# Patient Record
Sex: Male | Born: 1945 | Race: Black or African American | Hispanic: No | Marital: Married | Smoking: Former smoker
Health system: Southern US, Community
[De-identification: ages and names within clinical notes are randomized; demographics above are authoritative.]

## PROBLEM LIST (undated history)

## (undated) DIAGNOSIS — M1990 Unspecified osteoarthritis, unspecified site: Secondary | ICD-10-CM

## (undated) DIAGNOSIS — J9621 Acute and chronic respiratory failure with hypoxia: Secondary | ICD-10-CM

## (undated) DIAGNOSIS — I6322 Cerebral infarction due to unspecified occlusion or stenosis of basilar arteries: Secondary | ICD-10-CM

## (undated) DIAGNOSIS — I2699 Other pulmonary embolism without acute cor pulmonale: Secondary | ICD-10-CM

## (undated) DIAGNOSIS — I482 Chronic atrial fibrillation, unspecified: Secondary | ICD-10-CM

## (undated) DIAGNOSIS — N170 Acute kidney failure with tubular necrosis: Secondary | ICD-10-CM

## (undated) DIAGNOSIS — R6521 Severe sepsis with septic shock: Secondary | ICD-10-CM

## (undated) DIAGNOSIS — I219 Acute myocardial infarction, unspecified: Secondary | ICD-10-CM

## (undated) DIAGNOSIS — A419 Sepsis, unspecified organism: Secondary | ICD-10-CM

## (undated) DIAGNOSIS — J690 Pneumonitis due to inhalation of food and vomit: Secondary | ICD-10-CM

---

## 2018-03-28 ENCOUNTER — Inpatient Hospital Stay
Admission: AD | Admit: 2018-03-28 | Discharge: 2018-05-28 | Disposition: E | Payer: Medicare HMO | Source: Ambulatory Visit | Attending: Internal Medicine | Admitting: Internal Medicine

## 2018-03-28 ENCOUNTER — Other Ambulatory Visit (HOSPITAL_COMMUNITY): Payer: Self-pay

## 2018-03-28 DIAGNOSIS — Z992 Dependence on renal dialysis: Secondary | ICD-10-CM

## 2018-03-28 DIAGNOSIS — I482 Chronic atrial fibrillation, unspecified: Secondary | ICD-10-CM | POA: Diagnosis present

## 2018-03-28 DIAGNOSIS — J9690 Respiratory failure, unspecified, unspecified whether with hypoxia or hypercapnia: Secondary | ICD-10-CM

## 2018-03-28 DIAGNOSIS — R6521 Severe sepsis with septic shock: Secondary | ICD-10-CM

## 2018-03-28 DIAGNOSIS — J690 Pneumonitis due to inhalation of food and vomit: Secondary | ICD-10-CM | POA: Diagnosis present

## 2018-03-28 DIAGNOSIS — T8241XA Breakdown (mechanical) of vascular dialysis catheter, initial encounter: Secondary | ICD-10-CM

## 2018-03-28 DIAGNOSIS — N170 Acute kidney failure with tubular necrosis: Secondary | ICD-10-CM

## 2018-03-28 DIAGNOSIS — I2699 Other pulmonary embolism without acute cor pulmonale: Secondary | ICD-10-CM | POA: Diagnosis present

## 2018-03-28 DIAGNOSIS — I6322 Cerebral infarction due to unspecified occlusion or stenosis of basilar arteries: Secondary | ICD-10-CM | POA: Diagnosis present

## 2018-03-28 DIAGNOSIS — J189 Pneumonia, unspecified organism: Secondary | ICD-10-CM

## 2018-03-28 DIAGNOSIS — A419 Sepsis, unspecified organism: Secondary | ICD-10-CM | POA: Diagnosis present

## 2018-03-28 DIAGNOSIS — Z539 Procedure and treatment not carried out, unspecified reason: Secondary | ICD-10-CM

## 2018-03-28 DIAGNOSIS — J9621 Acute and chronic respiratory failure with hypoxia: Secondary | ICD-10-CM

## 2018-03-28 DIAGNOSIS — Z431 Encounter for attention to gastrostomy: Secondary | ICD-10-CM

## 2018-03-28 HISTORY — DX: Acute and chronic respiratory failure with hypoxia: J96.21

## 2018-03-28 HISTORY — DX: Severe sepsis with septic shock: R65.21

## 2018-03-28 HISTORY — DX: Unspecified osteoarthritis, unspecified site: M19.90

## 2018-03-28 HISTORY — DX: Acute kidney failure with tubular necrosis: N17.0

## 2018-03-28 HISTORY — DX: Chronic atrial fibrillation, unspecified: I48.20

## 2018-03-28 HISTORY — DX: Cerebral infarction due to unspecified occlusion or stenosis of basilar artery: I63.22

## 2018-03-28 HISTORY — DX: Pneumonitis due to inhalation of food and vomit: J69.0

## 2018-03-28 HISTORY — DX: Other pulmonary embolism without acute cor pulmonale: I26.99

## 2018-03-28 HISTORY — DX: Sepsis, unspecified organism: A41.9

## 2018-03-28 HISTORY — DX: Acute myocardial infarction, unspecified: I21.9

## 2018-03-28 LAB — PROTIME-INR
INR: 1.26
Prothrombin Time: 15.7 seconds — ABNORMAL HIGH (ref 11.4–15.2)

## 2018-03-28 LAB — C DIFFICILE QUICK SCREEN W PCR REFLEX
C DIFFICILE (CDIFF) INTERP: NOT DETECTED
C Diff antigen: NEGATIVE
C Diff toxin: NEGATIVE

## 2018-03-28 LAB — HEPARIN LEVEL (UNFRACTIONATED): Heparin Unfractionated: <0.1 IU/mL — ABNORMAL LOW (ref 0.30–0.70)

## 2018-03-28 MED ORDER — PNEUMOCOCCAL 13-VAL CONJ VACC IM SUSP
0.50 | INTRAMUSCULAR | Status: DC
Start: ? — End: 2018-03-28

## 2018-03-28 MED ORDER — DEXTROSE-NACL 5-0.9 % IV SOLN
50.00 | INTRAVENOUS | Status: DC
Start: ? — End: 2018-03-28

## 2018-03-28 MED ORDER — ACETAMINOPHEN 160 MG/5ML PO SUSP
650.00 | ORAL | Status: DC
Start: ? — End: 2018-03-28

## 2018-03-28 MED ORDER — LABETALOL HCL 100 MG PO TABS
100.00 | ORAL_TABLET | ORAL | Status: DC
Start: 2018-03-28 — End: 2018-03-28

## 2018-03-28 MED ORDER — GENERIC EXTERNAL MEDICATION
5.00 | Status: DC
Start: ? — End: 2018-03-28

## 2018-03-28 MED ORDER — POTASSIUM CHLORIDE CRYS ER 20 MEQ PO TBCR
20.00 | EXTENDED_RELEASE_TABLET | ORAL | Status: DC
Start: ? — End: 2018-03-28

## 2018-03-28 MED ORDER — IOPAMIDOL (ISOVUE-300) INJECTION 61%
INTRAVENOUS | Status: AC
  Administered 2018-03-28: 50 mL via GASTROSTOMY
  Filled 2018-03-28: qty 50

## 2018-03-28 MED ORDER — INSULIN LISPRO 100 UNIT/ML ~~LOC~~ SOLN
1.00 | SUBCUTANEOUS | Status: DC
Start: ? — End: 2018-03-28

## 2018-03-28 MED ORDER — ALTEPLASE 2 MG IJ SOLR
2.00 | INTRAMUSCULAR | Status: DC
Start: ? — End: 2018-03-28

## 2018-03-28 MED ORDER — MANNITOL 25 % IV SOLN
12.50 | INTRAVENOUS | Status: DC
Start: ? — End: 2018-03-28

## 2018-03-28 MED ORDER — ATORVASTATIN CALCIUM 20 MG PO TABS
20.00 | ORAL_TABLET | ORAL | Status: DC
Start: 2018-03-28 — End: 2018-03-28

## 2018-03-28 MED ORDER — NYSTATIN 100000 UNIT/ML MT SUSP
400000.00 | OROMUCOSAL | Status: DC
Start: 2018-03-28 — End: 2018-03-28

## 2018-03-28 MED ORDER — CLOPIDOGREL BISULFATE 75 MG PO TABS
75.00 | ORAL_TABLET | ORAL | Status: DC
Start: 2018-03-29 — End: 2018-03-28

## 2018-03-28 MED ORDER — POTASSIUM CHLORIDE 20 MEQ/15ML (10%) PO SOLN
20.00 | ORAL | Status: DC
Start: ? — End: 2018-03-28

## 2018-03-28 MED ORDER — POTASSIUM CHLORIDE 20 MEQ/50ML IV SOLN
20.00 | INTRAVENOUS | Status: DC
Start: ? — End: 2018-03-28

## 2018-03-28 MED ORDER — NICARDIPINE HCL IN NACL 40-0.83 MG/200ML-% IV SOLN
2.50 | INTRAVENOUS | Status: DC
Start: ? — End: 2018-03-28

## 2018-03-28 MED ORDER — ONDANSETRON HCL 4 MG/2ML IJ SOLN
4.00 | INTRAMUSCULAR | Status: DC
Start: ? — End: 2018-03-28

## 2018-03-28 MED ORDER — SODIUM CHLORIDE 0.9 % IV SOLN
150.00 | INTRAVENOUS | Status: DC
Start: ? — End: 2018-03-28

## 2018-03-28 MED ORDER — WARFARIN SODIUM 5 MG PO TABS
5.00 | ORAL_TABLET | ORAL | Status: DC
Start: 2018-03-28 — End: 2018-03-28

## 2018-03-28 MED ORDER — DIPHENHYDRAMINE HCL 50 MG/ML IJ SOLN
12.50 | INTRAMUSCULAR | Status: DC
Start: ? — End: 2018-03-28

## 2018-03-28 MED ORDER — AMLODIPINE BESYLATE 10 MG PO TABS
10.00 | ORAL_TABLET | ORAL | Status: DC
Start: 2018-03-29 — End: 2018-03-28

## 2018-03-28 MED ORDER — HEPARIN SOD (PORCINE) IN D5W 100 UNIT/ML IV SOLN
7.67 | INTRAVENOUS | Status: DC
Start: ? — End: 2018-03-28

## 2018-03-28 MED ORDER — INSULIN LISPRO 100 UNIT/ML ~~LOC~~ SOLN
1.00 | SUBCUTANEOUS | Status: DC
Start: 2018-03-28 — End: 2018-03-28

## 2018-03-28 MED ORDER — FAMOTIDINE 20 MG/2ML IV SOLN
20.00 | INTRAVENOUS | Status: DC
Start: ? — End: 2018-03-28

## 2018-03-28 MED ORDER — WARFARIN SODIUM 1 MG PO TABS
ORAL_TABLET | ORAL | Status: DC
Start: ? — End: 2018-03-28

## 2018-03-28 MED ORDER — CLOTRIMAZOLE 1 % EX CREA
TOPICAL_CREAM | CUTANEOUS | Status: DC
Start: 2018-03-28 — End: 2018-03-28

## 2018-03-28 MED ORDER — BISACODYL 10 MG RE SUPP
10.00 | RECTAL | Status: DC
Start: ? — End: 2018-03-28

## 2018-03-28 MED ORDER — HYDRALAZINE HCL 20 MG/ML IJ SOLN
10.00 | INTRAMUSCULAR | Status: DC
Start: ? — End: 2018-03-28

## 2018-03-28 MED ORDER — POLYETHYLENE GLYCOL 3350 17 G PO PACK
17.00 | PACK | ORAL | Status: DC
Start: 2018-03-28 — End: 2018-03-28

## 2018-03-28 MED ORDER — FUROSEMIDE 10 MG/ML IJ SOLN
80.00 | INTRAMUSCULAR | Status: DC
Start: 2018-03-28 — End: 2018-03-28

## 2018-03-28 MED ORDER — ACETAMINOPHEN 650 MG RE SUPP
650.00 | RECTAL | Status: DC
Start: ? — End: 2018-03-28

## 2018-03-28 MED ORDER — SODIUM CHLORIDE 0.9 % IJ SOLN
50.00 | INTRAMUSCULAR | Status: DC
Start: ? — End: 2018-03-28

## 2018-03-28 MED ORDER — GENERIC EXTERNAL MEDICATION
1.00 | Status: DC
Start: 2018-03-29 — End: 2018-03-28

## 2018-03-28 MED ORDER — INSULIN GLARGINE 100 UNIT/ML ~~LOC~~ SOLN
1.00 | SUBCUTANEOUS | Status: DC
Start: 2018-03-28 — End: 2018-03-28

## 2018-03-28 MED ORDER — ALBUMIN HUMAN 25 % IV SOLN
12.50 | INTRAVENOUS | Status: DC
Start: ? — End: 2018-03-28

## 2018-03-28 MED ORDER — DARBEPOETIN ALFA 40 MCG/ML IJ SOLN
40.00 | INTRAMUSCULAR | Status: DC
Start: 2018-04-03 — End: 2018-03-28

## 2018-03-28 MED ORDER — LABETALOL HCL 5 MG/ML IV SOLN
10.00 | INTRAVENOUS | Status: DC
Start: ? — End: 2018-03-28

## 2018-03-28 NOTE — Progress Notes (Signed)
[facility] CONSULT NOTE    Date: 04/05/2018                  Patient Name:  Curtis Hopkins  MRN: [id number]  DOB: 1946/10/10  Age / Sex: 72 y.o., male         PCP: System, Pcp Not In                 Service Requesting Consult: Hospitalist                 Reason for Consult: Acute renal failure            History of Present Illness: Patient is a 72 y.o. 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  Patient initially presented outside hospital with facial droop as well as dysarthria.  During this most recent hospitalization he also developed acute respiratory failure and is status post tracheostomy placement as well as PEG tube placement.  In addition he had worsening renal function during the course of the hospitalization and temporary left internal jugular dialysis catheter was placed and he was initiated on dialysis just yesterday.  His hospital course was also complicated by large pulmonary embolism of the right main pulmonary artery.  He was started on heparin drip for this purpose.   Medications:  Current medications: Amlodipine 10 mg daily, cefepime 1 g IV daily, heparin drip, Lipitor 20 mg nightly, nystatin 5 cc p.o. 4 times daily, warfarin 5 mg daily, labetalol 100 mg p.o. 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  Patient was a former smoker but quit in his 76s.  No alcohol or illicit drug use.  Review of Systems: Patient unable to provide as he is currently on the ventilator  Vital Signs: Pulse 80 respirations 16 blood pressure 153/74 pulse ox 91% Weight trends: There were no vitals filed for this visit.  Physical Exam: General: Critically ill appearing  Head: Normocephalic, atraumatic.  Eyes: Anicteric, spontaneous EOMs noted  Nose: Mucous membranes moist, not inflammed, nonerythematous.  Throat: Oral mucosa moist  Neck: Tracheostomy in place, L temporary dialysis catheter in place  Lungs:  Scattered rhonchi, normal effort  Heart: S1S2 no rubs  Abdomen:  Soft NTND BS present, PEG in place  Extremities: 1+ pretibial edema.  Neurologic: Arousable, does track with eyes  Skin: No visible rashes, scars.    Lab results: Basic Metabolic Panel: No results for input(s): NA, K, CL, CO2, GLUCOSE, BUN, CREATININE, CALCIUM, MG, PHOS in the last 168 hours.  Liver Function Tests: No results for input(s): AST, ALT, ALKPHOS, BILITOT, PROT, ALBUMIN in the last 168 hours. No results for input(s): LIPASE, AMYLASE in the last  168 hours. No results for input(s): AMMONIA in the last 168 hours.  CBC: No results for input(s): WBC, NEUTROABS, HGB, HCT, MCV, PLT in the last 168 hours.  Cardiac Enzymes: No results for input(s): CKTOTAL, CKMB, CKMBINDEX, TROPONINI in the last 168 hours.  BNP: Invalid input(s): POCBNP  CBG: No results for input(s): GLUCAP in the last 168 hours.  Microbiology: No results found for this or any previous visit.  Coagulation Studies: No results for input(s): LABPROT, INR  in the last 72 hours.  Urinalysis: No results for input(s): COLORURINE, LABSPEC, PHURINE, GLUCOSEU, HGBUR, BILIRUBINUR, KETONESUR, PROTEINUR, UROBILINOGEN, NITRITE, LEUKOCYTESUR in the last 72 hours.  Invalid input(s): APPERANCEUR    Imaging:  No results found.   Assessment & Plan: Pt is a 72 y.o. 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  Patient initially presented outside hospital with facial droop as well as dysarthria.  During prior hospitalization he also developed acute respiratory failure and is status post tracheostomy placement as well as PEG tube placement.  In addition he had worsening renal function during the course of the hospitalization and temporary left internal jugular dialysis catheter was placed and he was initiated on dialysis immediately before transfer here.   1.  Acute renal failure secondary to ATN. 2.  Acute respiratory failure. 3.  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 4.  Anemia unspecified.  Plan:  We were asked to see the patient for evaluation of his acute renal failure.  He was started on dialysis immediately prior to being discharged here.  It appears that he tolerated his first dialysis treatment well.  We will plan for dialysis again tomorrow.  He is making urine at the moment.  Therefore we remain hopeful that he will be able to come off of dialysis over the course of this hospitalization.  Pulmonary/critical care has also been consulted and hopefully patient can be weaned from the ventilator.  Nutritional support as per hospitalist.  Further plan as patient progresses.  1.  Acute

## 2018-03-29 ENCOUNTER — Encounter: Payer: Self-pay | Admitting: Internal Medicine

## 2018-03-29 DIAGNOSIS — J690 Pneumonitis due to inhalation of food and vomit: Secondary | ICD-10-CM | POA: Diagnosis present

## 2018-03-29 DIAGNOSIS — A419 Sepsis, unspecified organism: Secondary | ICD-10-CM | POA: Diagnosis not present

## 2018-03-29 DIAGNOSIS — J9621 Acute and chronic respiratory failure with hypoxia: Secondary | ICD-10-CM

## 2018-03-29 DIAGNOSIS — R6521 Severe sepsis with septic shock: Secondary | ICD-10-CM

## 2018-03-29 DIAGNOSIS — I2699 Other pulmonary embolism without acute cor pulmonale: Secondary | ICD-10-CM | POA: Diagnosis not present

## 2018-03-29 DIAGNOSIS — I482 Chronic atrial fibrillation, unspecified: Secondary | ICD-10-CM | POA: Diagnosis present

## 2018-03-29 DIAGNOSIS — I6322 Cerebral infarction due to unspecified occlusion or stenosis of basilar arteries: Secondary | ICD-10-CM | POA: Diagnosis present

## 2018-03-29 HISTORY — DX: Acute and chronic respiratory failure with hypoxia: J96.21

## 2018-03-29 LAB — COMPREHENSIVE METABOLIC PANEL
ALK PHOS: 139 U/L — AB (ref 38–126)
ALT: 134 U/L — AB (ref 17–63)
AST: 114 U/L — ABNORMAL HIGH (ref 15–41)
Albumin: 1.5 g/dL — ABNORMAL LOW (ref 3.5–5.0)
Anion gap: 12 (ref 5–15)
BILIRUBIN TOTAL: 0.5 mg/dL (ref 0.3–1.2)
BUN: 117 mg/dL — ABNORMAL HIGH (ref 6–20)
CALCIUM: 7.8 mg/dL — AB (ref 8.9–10.3)
CO2: 17 mmol/L — ABNORMAL LOW (ref 22–32)
CREATININE: 5.74 mg/dL — AB (ref 0.61–1.24)
Chloride: 110 mmol/L (ref 101–111)
GFR, EST AFRICAN AMERICAN: 10 mL/min — AB (ref >60)
GFR, EST NON AFRICAN AMERICAN: 9 mL/min — AB (ref >60)
Glucose, Bld: 98 mg/dL (ref 65–99)
Potassium: 4.9 mmol/L (ref 3.5–5.1)
Sodium: 139 mmol/L (ref 135–145)
Total Protein: 6.1 g/dL — ABNORMAL LOW (ref 6.5–8.1)

## 2018-03-29 LAB — URINALYSIS, ROUTINE W REFLEX MICROSCOPIC
Bilirubin Urine: NEGATIVE
GLUCOSE, UA: NEGATIVE mg/dL
Ketones, ur: NEGATIVE mg/dL
Leukocytes, UA: NEGATIVE
NITRITE: NEGATIVE
Protein, ur: NEGATIVE mg/dL
SPECIFIC GRAVITY, URINE: 1.008 (ref 1.005–1.030)
pH: 5 (ref 5.0–8.0)

## 2018-03-29 LAB — TSH: TSH: 1.998 u[IU]/mL (ref 0.350–4.500)

## 2018-03-29 LAB — CBC WITH DIFFERENTIAL/PLATELET
Basophils Absolute: 0 10*3/uL (ref 0.0–0.1)
Basophils Relative: 0 %
EOS ABS: 0.5 10*3/uL (ref 0.0–0.7)
EOS PCT: 3 %
HCT: 24.4 % — ABNORMAL LOW (ref 39.0–52.0)
Hemoglobin: 8.2 g/dL — ABNORMAL LOW (ref 13.0–17.0)
LYMPHS ABS: 1.4 10*3/uL (ref 0.7–4.0)
LYMPHS PCT: 8 %
MCH: 29.1 pg (ref 26.0–34.0)
MCHC: 33.6 g/dL (ref 30.0–36.0)
MCV: 86.5 fL (ref 78.0–100.0)
MONO ABS: 1.3 10*3/uL — AB (ref 0.1–1.0)
MONOS PCT: 8 %
Neutro Abs: 13.4 10*3/uL — ABNORMAL HIGH (ref 1.7–7.7)
Neutrophils Relative %: 81 %
PLATELETS: 307 10*3/uL (ref 150–400)
RBC: 2.82 MIL/uL — ABNORMAL LOW (ref 4.22–5.81)
RDW: 16.5 % — AB (ref 11.5–15.5)
WBC: 16.7 10*3/uL — ABNORMAL HIGH (ref 4.0–10.5)

## 2018-03-29 LAB — HEPARIN LEVEL (UNFRACTIONATED): HEPARIN UNFRACTIONATED: 0.38 [IU]/mL (ref 0.30–0.70)

## 2018-03-29 LAB — PROTIME-INR
INR: 1.4
Prothrombin Time: 17 seconds — ABNORMAL HIGH (ref 11.4–15.2)

## 2018-03-29 LAB — APTT: aPTT: 58 seconds — ABNORMAL HIGH (ref 24–36)

## 2018-03-29 LAB — HEMOGLOBIN A1C
HEMOGLOBIN A1C: 6.5 % — AB (ref 4.8–5.6)
Mean Plasma Glucose: 139.85 mg/dL

## 2018-03-29 LAB — MAGNESIUM: MAGNESIUM: 2.7 mg/dL — AB (ref 1.7–2.4)

## 2018-03-29 LAB — PHOSPHORUS: PHOSPHORUS: 6.5 mg/dL — AB (ref 2.5–4.6)

## 2018-03-29 NOTE — Consult Note (Signed)
Pulmonary Critical Care Medicine [facility] PULMONARY SERVICE  Date of Service: 03/29/2018  PULMONARY CONSULT   Curtis Hopkins  ZOX:[id number]  DOB: 1946/06/14   DOA: 03/30/2018  Referring Physician: Carron Curie, MD  HPI: Curtis Hopkins is a 72 y.o. male seen for follow up of Acute on Chronic Respiratory Failure.  She has multiple medical problems including presented to hospital with acute stroke involving the basilar artery.  Patient had an angioplasty done for the stroke.  Postoperatively had a complicated course was not able to really wean off the ventilator.  The patient eventually ended up having to have a tracheostomy and PEG tube placement.  Patient's other complications included development of a pulmonary embolism which was treated with heparin.  Also patient had significant worsening of renal failure.  It appears by history that there was some improvement on the renal failure.  Patient now presents our facility on T collar has been on 40% oxygen and been gradually weaning off of the ventilator.  Family was present in the room and they were updated.  Review of Systems:  ROS performed and is unremarkable other than noted above.  Past Medical History:  Diagnosis Date  . Acute on chronic respiratory failure with hypoxia (HCC) 03/29/2018  . Acute pulmonary embolism (HCC)   . Arthritis   . Aspiration pneumonia (HCC)   . Atrial fibrillation, chronic (HCC)   . Myocardial infarction (HCC)   . 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 No pertinent surgical history.  Social History:    reports that he has quit smoking. He has quit using smokeless tobacco. He reports that he drank alcohol. He reports that he has current or past drug history.  Family History: Non-Contributory to the present illness  No Known Allergies  Medications: Reviewed on Rounds  Physical Exam:  Vitals: Temperature 98.4 pulse 79 respiratory rate 23 blood  pressure 101/48 saturations 94%  Ventilator Settings currently on T collar FiO2 40%  . General: Comfortable at this time . Eyes: Grossly normal lids, irises & conjunctiva . ENT: grossly tongue is normal . Neck: no obvious mass . Cardiovascular: S1-S2 normal no gallop rub . Respiratory: No rhonchi expansion equal . Abdomen: Obese and soft . Skin: no rash seen on limited exam . Musculoskeletal: not rigid . Psychiatric:unable to assess . Neurologic: no seizure no involuntary movements         Labs on Admission:  Basic Metabolic Panel: Recent Labs  Lab 03/29/18 0731  NA 139  K 4.9  CL 110  CO2 17*  GLUCOSE 98  BUN 117*  CREATININE 5.74*  CALCIUM 7.8*  MG 2.7*  PHOS 6.5*    Liver Function Tests: Recent Labs  Lab 03/29/18 0731  AST 114*  ALT 134*  ALKPHOS 139*  BILITOT 0.5  PROT 6.1*  ALBUMIN 1.5*   No results for input(s): LIPASE, AMYLASE in the last 168 hours. No results for input(s): AMMONIA in the last 168 hours.  CBC: Recent Labs  Lab 03/29/18 0731  WBC 16.7*  NEUTROABS 13.4*  HGB 8.2*  HCT 24.4*  MCV 86.5  PLT 307    Cardiac Enzymes: No results for input(s): CKTOTAL, CKMB, CKMBINDEX, TROPONINI in the last 168 hours.  BNP (last 3 results) No results for input(s): BNP in the last 8760 hours.  ProBNP (last 3 results) No results for input(s): PROBNP in the last 8760 hours.   Radiological Exams on Admission: Dg Abdomen Peg Tube Location  Result Date: 04/09/2018 CLINICAL DATA:  Check peg tube placement with contrast. EXAM: ABDOMEN - 1 VIEW COMPARISON:  None. FINDINGS: The bowel gas pattern is normal. Enteric contrast is seen within the lumen of the stomach without extravasation. Peg tube tip projects over the proximal stomach. No radio-opaque calculi or other significant radiographic abnormality are seen. IMPRESSION: Tip of PEG tube is outlined by enteric contrast within the lumen of the stomach. Electronically Signed   By: Tollie Eth M.D.   On:  04/26/2018 19:58   Dg Chest Port 1 View  Result Date: 04/09/2018 CLINICAL DATA:  Trach tube placement EXAM: PORTABLE CHEST 1 VIEW COMPARISON:  None. FINDINGS: 1739 hours. Tracheostomy tube overlies the midline upper chest with the tip positioned about 6 cm above the carina. Left IJ central line tip overlies the proximal SVC in the tip is directed laterally, likely against the vessel wall. Right PICC line tip overlies the distal SVC. Lung volumes are low with bibasilar collapse/consolidation, right greater than left. Vascular congestion evident. Probable small bilateral pleural effusions. Telemetry leads overlie the chest. IMPRESSION: 1. Low volume film with bibasilar collapse/consolidation, right greater than left and small bilateral pleural effusions. 2. Left IJ central line tip is directed laterally, likely against the wall of the proximal SVC. 3. Right PICC line tip overlies the mid to distal SVC. Electronically Signed   By: Kennith Center M.D.   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 Acute on chronic respiratory failure with hypoxia-patient is weaning to try to advance his time off of the ventilator on T collar trials.  Currently is requiring 40% oxygen need to monitor closely.  We will continue with pulmonary toilet supportive care hopefully we will be able to advance aggressively. 2. Chronic atrial fibrillation rate is controlled right now we will continue to monitor. 3. Acute stroke due to occlusion of the basilar artery will need ongoing rehab physical therapy occupational therapy 4. Pneumonia due to aspiration patient was treated with antibiotics at the transferring facility. 5. Sepsis with shock  clinically has been improved we will continue to follow along 6. Acute renal failure patient was seen by nephrology and will be assessed for by dialysis. 7. Pulmonary embolism patient was treated with anticoagulation need to continue with supportive care  I have personally seen and evaluated the patient, evaluated laboratory and imaging results, formulated the assessment and plan and placed orders. The Patient requires high complexity decision making for assessment and support.  Case was discussed on Rounds with the Respiratory Therapy Staff Time Spent  Yevonne Pax, MD [facility] Pulmonary Critical Care Medicine Sleep Medicine

## 2018-03-30 DIAGNOSIS — I2699 Other pulmonary embolism without acute cor pulmonale: Secondary | ICD-10-CM | POA: Diagnosis not present

## 2018-03-30 DIAGNOSIS — I482 Chronic atrial fibrillation: Secondary | ICD-10-CM | POA: Diagnosis not present

## 2018-03-30 DIAGNOSIS — J690 Pneumonitis due to inhalation of food and vomit: Secondary | ICD-10-CM | POA: Diagnosis not present

## 2018-03-30 DIAGNOSIS — J9621 Acute and chronic respiratory failure with hypoxia: Secondary | ICD-10-CM | POA: Diagnosis not present

## 2018-03-30 LAB — CBC
HEMATOCRIT: 25.8 % — AB (ref 39.0–52.0)
HEMOGLOBIN: 8.6 g/dL — AB (ref 13.0–17.0)
MCH: 29.4 pg (ref 26.0–34.0)
MCHC: 33.3 g/dL (ref 30.0–36.0)
MCV: 88.1 fL (ref 78.0–100.0)
Platelets: 305 10*3/uL (ref 150–400)
RBC: 2.93 MIL/uL — AB (ref 4.22–5.81)
RDW: 16.8 % — ABNORMAL HIGH (ref 11.5–15.5)
WBC: 15.9 10*3/uL — ABNORMAL HIGH (ref 4.0–10.5)

## 2018-03-30 LAB — PROTIME-INR
INR: 1.56
Prothrombin Time: 18.5 seconds — ABNORMAL HIGH (ref 11.4–15.2)

## 2018-03-30 LAB — HEPARIN LEVEL (UNFRACTIONATED)
HEPARIN UNFRACTIONATED: 0.13 [IU]/mL — AB (ref 0.30–0.70)
Heparin Unfractionated: 0.77 IU/mL — ABNORMAL HIGH (ref 0.30–0.70)

## 2018-03-30 LAB — RENAL FUNCTION PANEL
ANION GAP: 13 (ref 5–15)
Albumin: 1.5 g/dL — ABNORMAL LOW (ref 3.5–5.0)
BUN: 126 mg/dL — ABNORMAL HIGH (ref 6–20)
CALCIUM: 8 mg/dL — AB (ref 8.9–10.3)
CO2: 17 mmol/L — AB (ref 22–32)
Chloride: 111 mmol/L (ref 101–111)
Creatinine, Ser: 5.73 mg/dL — ABNORMAL HIGH (ref 0.61–1.24)
GFR calc non Af Amer: 9 mL/min — ABNORMAL LOW (ref >60)
GFR, EST AFRICAN AMERICAN: 10 mL/min — AB (ref >60)
GLUCOSE: 114 mg/dL — AB (ref 65–99)
PHOSPHORUS: 7.6 mg/dL — AB (ref 2.5–4.6)
POTASSIUM: 5.2 mmol/L — AB (ref 3.5–5.1)
SODIUM: 141 mmol/L (ref 135–145)

## 2018-03-30 LAB — PTH, INTACT AND CALCIUM
CALCIUM TOTAL (PTH): 7.4 mg/dL — AB (ref 8.6–10.2)
PTH: 83 pg/mL — AB (ref 15–65)

## 2018-03-30 LAB — MAGNESIUM: MAGNESIUM: 2.9 mg/dL — AB (ref 1.7–2.4)

## 2018-03-30 LAB — HEPATITIS B CORE ANTIBODY, TOTAL: Hep B Core Total Ab: NEGATIVE

## 2018-03-30 LAB — HEPATITIS B SURFACE ANTIBODY, QUANTITATIVE: Hepatitis B-Post: <3.1 m[IU]/mL — ABNORMAL LOW (ref >9.9)

## 2018-03-30 LAB — HEPATITIS B SURFACE ANTIGEN: Hepatitis B Surface Ag: NEGATIVE

## 2018-03-30 NOTE — Progress Notes (Signed)
[name]  ROUNDING NOTE   Subjective:  Patient seen and evaluated during hemodialysis today. He appears to be tolerating this well. Tracheostomy in place and functional.    Objective:  Vital signs in last 24 hours:  Temperature 98.1 pulse 70 respiration 16 blood pressure 104/51  Physical Exam: General: No acute distress  Head: Normocephalic, atraumatic. Moist oral mucosal membranes  Eyes: Anicteric  Neck:  Tracheostomy present  Lungs:   Scattered rhonchi, normal effort  Heart: S1S2 no rubs  Abdomen:  Soft, nontender, bowel sounds present, PEG present  Extremities: 2+ peripheral edema.  Neurologic: Awake, alert, not consistently following commands  Skin: No lesions  Access: Left internal jugular temporary dialysis catheter    Basic Metabolic Panel: Recent Labs  Lab 03/29/18 0731  NA 139  K 4.9  CL 110  CO2 17*  GLUCOSE 98  BUN 117*  CREATININE 5.74*  CALCIUM 7.8*  7.4*  MG 2.7*  PHOS 6.5*    Liver Function Tests: Recent Labs  Lab 03/29/18 0731  AST 114*  ALT 134*  ALKPHOS 139*  BILITOT 0.5  PROT 6.1*  ALBUMIN 1.5*   No results for input(s): LIPASE, AMYLASE in the last 168 hours. No results for input(s): AMMONIA in the last 168 hours.  CBC: Recent Labs  Lab 03/29/18 0731 03/30/18 0648  WBC 16.7* 15.9*  NEUTROABS 13.4*  --   HGB 8.2* 8.6*  HCT 24.4* 25.8*  MCV 86.5 88.1  PLT 307 305    Cardiac Enzymes: No results for input(s): CKTOTAL, CKMB, CKMBINDEX, TROPONINI in the last 168 hours.  BNP: Invalid input(s): POCBNP  CBG: No results for input(s): GLUCAP in the last 168 hours.  Microbiology: Results for orders placed or performed during the hospital encounter of 2018/04/16  C difficile quick scan w PCR reflex     Status: None   Collection Time: Apr 16, 2018  3:01 PM  Result Value Ref Range Status   C Diff antigen NEGATIVE NEGATIVE Final   C Diff toxin NEGATIVE NEGATIVE Final   C Diff interpretation No C. difficile detected.   Final    Comment: Performed at [facility], [address], [place], [place] [address]    Coagulation Studies: Recent Labs    2018/04/16 1931 03/29/18 0731 03/30/18 0648  LABPROT 15.7* 17.0* 18.5*  INR 1.26 1.40 1.56    Urinalysis: Recent Labs    03/29/18 2245  COLORURINE YELLOW  LABSPEC 1.008  PHURINE 5.0  GLUCOSEU NEGATIVE  HGBUR LARGE*  BILIRUBINUR NEGATIVE  KETONESUR NEGATIVE  PROTEINUR NEGATIVE  NITRITE NEGATIVE  LEUKOCYTESUR NEGATIVE      Imaging: Dg Abdomen Peg Tube Location  Result Date: 04/16/2018 CLINICAL DATA:  Check peg tube placement with contrast. EXAM: ABDOMEN - 1 VIEW COMPARISON:  None. FINDINGS: The bowel gas pattern is normal. Enteric contrast is seen within the lumen of the stomach without extravasation. Peg tube tip projects over the proximal stomach. No radio-opaque calculi or other significant radiographic abnormality are seen. IMPRESSION: Tip of PEG tube is outlined by enteric contrast within the lumen of the stomach. Electronically Signed   By: Tollie Eth M.D.   On: 16-Apr-2018 19:58   Dg Chest Port 1 View  Result Date: 2018/04/16 CLINICAL DATA:  Trach tube placement EXAM: PORTABLE CHEST 1 VIEW COMPARISON:  None. FINDINGS: 1739 hours. Tracheostomy tube overlies the midline upper chest with the tip positioned about 6 cm above the carina. Left IJ central line tip overlies the proximal SVC in the tip is  directed laterally, likely against the vessel wall. Right PICC line tip overlies the distal SVC. Lung volumes are low with bibasilar collapse/consolidation, right greater than left. Vascular congestion evident. Probable small bilateral pleural effusions. Telemetry leads overlie the chest. IMPRESSION: 1. Low volume film with bibasilar collapse/consolidation, right greater than left and small bilateral pleural effusions. 2. Left IJ central line tip is directed laterally, likely against the wall of the proximal SVC. 3. Right PICC line tip overlies the mid to  distal SVC. Electronically Signed   By: Kennith Center M.D.   On: 04/16/18 19:56     Medications:       Assessment/ Plan:  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  Patient initially presented outside hospital with facial droop as well as dysarthria.  During prior hospitalization he also developed acute respiratory failure and is status post tracheostomy placement as well as PEG tube placement.  In addition he had worsening renal function during the course of the hospitalization and temporary left internal jugular dialysis catheter was placed and he was initiated on dialysis immediately before transfer here.   1.  Acute renal failure secondary to ATN. 2.  Acute respiratory failure. 3.  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 4.  Anemia unspecified.  Plan: Patient seen and evaluated during dialysis.  He appears to be tolerating well.  He was to be dialyzed yesterday however secondary to dialysis nurse scheduling issues he is being dialyzed today.  We will plan for dialysis again tomorrow as well.  Continue to monitor serum electrolytes as well as CBC.  Hopefully his renal function will recover over the course of the hospitalization.   LOS: 0 Kelven Flater 5/3/20198:52 AM

## 2018-03-30 NOTE — Progress Notes (Addendum)
Pulmonary Critical Care Medicine [facility]   PULMONARY SERVICE  PROGRESS NOTE  Date of Service: 03/30/2018  Curtis Hopkins  VFI:[id number]  DOB: 08/30/1946   DOA: 04/20/2018  Referring Physician: Carron Curie, MD  HPI: Curtis Hopkins is a 72 y.o. male seen for follow up of Acute on Chronic Respiratory Failure.  Patient is on T collar has been weaning the goal is for about 12 hours today.  So far looks good.  Medications: Reviewed on Rounds  Physical Exam:  Vitals: Temperature 98.1 pulse 70 respiratory rate 16 blood pressure 12/01/1948 saturations 100%  Ventilator Settings aerosolized T collar FiO2 40%  . General: Comfortable at this time . Eyes: Grossly normal lids, irises & conjunctiva . ENT: grossly tongue is normal . Neck: no obvious mass . Cardiovascular: S1-S2 normal no gallop or rub . Respiratory: No rhonchi noted either side . Abdomen: Soft nondistended . Skin: no rash seen on limited exam . Musculoskeletal: not rigid . Psychiatric:unable to assess . Neurologic: no seizure no involuntary movements         Labs on Admission:  Basic Metabolic Panel: Recent Labs  Lab 03/29/18 0731 03/30/18 0648  NA 139 141  K 4.9 5.2*  CL 110 111  CO2 17* 17*  GLUCOSE 98 114*  BUN 117* 126*  CREATININE 5.74* 5.73*  CALCIUM 7.8*  7.4* 8.0*  MG 2.7* 2.9*  PHOS 6.5* 7.6*    Liver Function Tests: Recent Labs  Lab 03/29/18 0731 03/30/18 0648  AST 114*  --   ALT 134*  --   ALKPHOS 139*  --   BILITOT 0.5  --   PROT 6.1*  --   ALBUMIN 1.5* 1.5*   No results for input(s): LIPASE, AMYLASE in the last 168 hours. No results for input(s): AMMONIA in the last 168 hours.  CBC: Recent Labs  Lab 03/29/18 0731 03/30/18 0648  WBC 16.7* 15.9*  NEUTROABS 13.4*  --   HGB 8.2* 8.6*  HCT 24.4* 25.8*  MCV 86.5 88.1  PLT 307 305    Cardiac Enzymes: No results for input(s): CKTOTAL, CKMB, CKMBINDEX, TROPONINI in the last 168 hours.  BNP (last 3  results) No results for input(s): BNP in the last 8760 hours.  ProBNP (last 3 results) No results for input(s): PROBNP in the last 8760 hours.  Radiological Exams on Admission: Dg Abdomen Peg Tube Location  Result Date: 04/11/2018 CLINICAL DATA:  Check peg tube placement with contrast. EXAM: ABDOMEN - 1 VIEW COMPARISON:  None. FINDINGS: The bowel gas pattern is normal. Enteric contrast is seen within the lumen of the stomach without extravasation. Peg tube tip projects over the proximal stomach. No radio-opaque calculi or other significant radiographic abnormality are seen. IMPRESSION: Tip of PEG tube is outlined by enteric contrast within the lumen of the stomach. Electronically Signed   By: Tollie Eth M.D.   On: 04/10/2018 19:58   Dg Chest Port 1 View  Result Date: 04/20/2018 CLINICAL DATA:  Trach tube placement EXAM: PORTABLE CHEST 1 VIEW COMPARISON:  None. FINDINGS: 1739 hours. Tracheostomy tube overlies the midline upper chest with the tip positioned about 6 cm above the carina. Left IJ central line tip overlies the proximal SVC in the tip is directed laterally, likely against the vessel wall. Right PICC line tip overlies the distal SVC. Lung volumes are low with bibasilar collapse/consolidation, right greater than left. Vascular congestion evident. Probable small bilateral pleural effusions. Telemetry leads overlie the chest. IMPRESSION: 1. Low volume film  with bibasilar collapse/consolidation, right greater than left and small bilateral pleural effusions. 2. Left IJ central line tip is directed laterally, likely against the wall of the proximal SVC. 3. Right PICC line tip overlies the mid to distal SVC. Electronically Signed   By: Kennith Center M.D.   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 Acute on chronic respiratory failure with hypoxia we will continue weaning on T collar as noted.  Secretions are minimal right now we will continue with pulmonary toilet supportive care 2. Atrial fibrillation rate is controlled we will continue to follow 3. Pneumonia due to aspiration treated with antibiotics we will continue to follow 4. Pulmonary embolism treated we will continue to monitor closely no active bleeding 5. Severe sepsis with shock resolved hemodynamically stable   I have personally seen and evaluated the patient, evaluated laboratory and imaging results, formulated the assessment and plan and placed orders. The Patient requires high complexity decision making for assessment and support.  Case was discussed on Rounds with the Respiratory Therapy Staff time spent 35 minutes  Yevonne Pax, MD [facility] Pulmonary Critical Care Medicine Sleep Medicine

## 2018-03-31 LAB — CBC
HEMATOCRIT: 27.2 % — AB (ref 39.0–52.0)
HEMOGLOBIN: 8.8 g/dL — AB (ref 13.0–17.0)
MCH: 28.6 pg (ref 26.0–34.0)
MCHC: 32.4 g/dL (ref 30.0–36.0)
MCV: 88.3 fL (ref 78.0–100.0)
Platelets: 295 10*3/uL (ref 150–400)
RBC: 3.08 MIL/uL — ABNORMAL LOW (ref 4.22–5.81)
RDW: 16.6 % — ABNORMAL HIGH (ref 11.5–15.5)
WBC: 15.5 10*3/uL — ABNORMAL HIGH (ref 4.0–10.5)

## 2018-03-31 LAB — RENAL FUNCTION PANEL
Albumin: 1.7 g/dL — ABNORMAL LOW (ref 3.5–5.0)
Anion gap: 10 (ref 5–15)
BUN: 71 mg/dL — ABNORMAL HIGH (ref 6–20)
CHLORIDE: 107 mmol/L (ref 101–111)
CO2: 22 mmol/L (ref 22–32)
CREATININE: 4.14 mg/dL — AB (ref 0.61–1.24)
Calcium: 7.8 mg/dL — ABNORMAL LOW (ref 8.9–10.3)
GFR, EST AFRICAN AMERICAN: 15 mL/min — AB (ref >60)
GFR, EST NON AFRICAN AMERICAN: 13 mL/min — AB (ref >60)
Glucose, Bld: 136 mg/dL — ABNORMAL HIGH (ref 65–99)
POTASSIUM: 4.4 mmol/L (ref 3.5–5.1)
Phosphorus: 5.7 mg/dL — ABNORMAL HIGH (ref 2.5–4.6)
Sodium: 139 mmol/L (ref 135–145)

## 2018-03-31 LAB — HEPATITIS B SURFACE ANTIGEN: Hepatitis B Surface Ag: NEGATIVE

## 2018-03-31 LAB — PROTIME-INR
INR: 1.81
Prothrombin Time: 20.8 seconds — ABNORMAL HIGH (ref 11.4–15.2)

## 2018-03-31 LAB — URINE CULTURE: Culture: NO GROWTH

## 2018-03-31 LAB — HEPARIN LEVEL (UNFRACTIONATED)
HEPARIN UNFRACTIONATED: 0.48 [IU]/mL (ref 0.30–0.70)
Heparin Unfractionated: 0.13 IU/mL — ABNORMAL LOW (ref 0.30–0.70)

## 2018-03-31 LAB — HEPATITIS B CORE ANTIBODY, TOTAL: Hep B Core Total Ab: NEGATIVE

## 2018-03-31 LAB — MAGNESIUM: MAGNESIUM: 2.3 mg/dL (ref 1.7–2.4)

## 2018-03-31 LAB — HEPATITIS B SURFACE ANTIBODY,QUALITATIVE: HEP B S AB: NONREACTIVE

## 2018-04-01 DIAGNOSIS — J690 Pneumonitis due to inhalation of food and vomit: Secondary | ICD-10-CM | POA: Diagnosis not present

## 2018-04-01 DIAGNOSIS — I482 Chronic atrial fibrillation: Secondary | ICD-10-CM | POA: Diagnosis not present

## 2018-04-01 DIAGNOSIS — I2699 Other pulmonary embolism without acute cor pulmonale: Secondary | ICD-10-CM | POA: Diagnosis not present

## 2018-04-01 DIAGNOSIS — J9621 Acute and chronic respiratory failure with hypoxia: Secondary | ICD-10-CM | POA: Diagnosis not present

## 2018-04-01 LAB — HEPARIN LEVEL (UNFRACTIONATED)
Heparin Unfractionated: 0.25 IU/mL — ABNORMAL LOW (ref 0.30–0.70)
Heparin Unfractionated: 0.54 IU/mL (ref 0.30–0.70)

## 2018-04-01 LAB — CULTURE, RESPIRATORY

## 2018-04-01 LAB — PROTIME-INR
INR: 1.99
Prothrombin Time: 22.4 seconds — ABNORMAL HIGH (ref 11.4–15.2)

## 2018-04-01 LAB — CULTURE, RESPIRATORY W GRAM STAIN: Culture: NORMAL

## 2018-04-01 NOTE — Progress Notes (Signed)
Pulmonary Critical Care Medicine [facility] [place]   PULMONARY SERVICE  PROGRESS NOTE  Date of Service: 04/01/2018  Curtis Hopkins  ZOX:[id number]  DOB: Feb 02, 1946   DOA: 04/27/2018  Referring Physician: Carron Curie, MD  HPI: Curtis Hopkins is a 72 y.o. male seen for follow up of Acute on Chronic Respiratory Failure.  Patient is on T collar right now has been using the ventilator at nighttime.  Currently is on 35% oxygen.  Medications: Reviewed on Rounds  Physical Exam:  Vitals: Temperature 97.0 pulse 72 respiratory rate 18 blood pressure 1 4472 saturations 97%  Ventilator Settings on T collar FiO2 35%  . General: Comfortable at this time . Eyes: Grossly normal lids, irises & conjunctiva . ENT: grossly tongue is normal . Neck: no obvious mass . Cardiovascular: S1-S2 normal no gallop or rub . Respiratory: Equal expansion coarse breath sounds . Abdomen: Soft nondistended . Skin: no rash seen on limited exam . Musculoskeletal: not rigid . Psychiatric:unable to assess . Neurologic: no seizure no involuntary movements         Labs on Admission:  Basic Metabolic Panel: Recent Labs  Lab 03/29/18 0731 03/30/18 0648 03/31/18 0707  NA 139 141 139  K 4.9 5.2* 4.4  CL 110 111 107  CO2 17* 17* 22  GLUCOSE 98 114* 136*  BUN 117* 126* 71*  CREATININE 5.74* 5.73* 4.14*  CALCIUM 7.8*  7.4* 8.0* 7.8*  MG 2.7* 2.9* 2.3  PHOS 6.5* 7.6* 5.7*    Liver Function Tests: Recent Labs  Lab 03/29/18 0731 03/30/18 0648 03/31/18 0707  AST 114*  --   --   ALT 134*  --   --   ALKPHOS 139*  --   --   BILITOT 0.5  --   --   PROT 6.1*  --   --   ALBUMIN 1.5* 1.5* 1.7*   No results for input(s): LIPASE, AMYLASE in the last 168 hours. No results for input(s): AMMONIA in the last 168 hours.  CBC: Recent Labs  Lab 03/29/18 0731 03/30/18 0648 03/31/18 0707  WBC 16.7* 15.9* 15.5*  NEUTROABS 13.4*  --   --   HGB 8.2* 8.6* 8.8*  HCT 24.4* 25.8* 27.2*  MCV  86.5 88.1 88.3  PLT 307 305 295    Cardiac Enzymes: No results for input(s): CKTOTAL, CKMB, CKMBINDEX, TROPONINI in the last 168 hours.  BNP (last 3 results) No results for input(s): BNP in the last 8760 hours.  ProBNP (last 3 results) No results for input(s): PROBNP in the last 8760 hours.  Radiological Exams on Admission: Dg Abdomen Peg Tube Location  Result Date: 04/24/2018 CLINICAL DATA:  Check peg tube placement with contrast. EXAM: ABDOMEN - 1 VIEW COMPARISON:  None. FINDINGS: The bowel gas pattern is normal. Enteric contrast is seen within the lumen of the stomach without extravasation. Peg tube tip projects over the proximal stomach. No radio-opaque calculi or other significant radiographic abnormality are seen. IMPRESSION: Tip of PEG tube is outlined by enteric contrast within the lumen of the stomach. Electronically Signed   By: Tollie Eth M.D.   On: 04/22/2018 19:58   Dg Chest Port 1 View  Result Date: 04/18/2018 CLINICAL DATA:  Trach tube placement EXAM: PORTABLE CHEST 1 VIEW COMPARISON:  None. FINDINGS: 1739 hours. Tracheostomy tube overlies the midline upper chest with the tip positioned about 6 cm above the carina. Left IJ central line tip overlies the proximal SVC in the tip is directed laterally, likely against  the vessel wall. Right PICC line tip overlies the distal SVC. Lung volumes are low with bibasilar collapse/consolidation, right greater than left. Vascular congestion evident. Probable small bilateral pleural effusions. Telemetry leads overlie the chest. IMPRESSION: 1. Low volume film with bibasilar collapse/consolidation, right greater than left and small bilateral pleural effusions. 2. Left IJ central line tip is directed laterally, likely against the wall of the proximal SVC. 3. Right PICC line tip overlies the mid to distal SVC. Electronically Signed   By: Kennith Center M.D.   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 Acute on chronic respiratory failure with hypoxia we will continue with S/T collar wean during the daytime will extended to 18 hours now we will continue to rest on the ventilator at nighttime.  We will continue pulmonary toilet supportive care 2. Acute pulmonary embolism clinically treated we will continue to monitor 3. Stroke 4. Aspiration pneumonia treated with antibiotics we will continue to follow 5. Severe sepsis with shock now is better 6. Chronic atrial fibrillation rate is well controlled at this time.   I have personally seen and evaluated the patient, evaluated laboratory and imaging results, formulated the assessment and plan and placed orders. The Patient requires high complexity decision making for assessment and support.  Case was discussed on Rounds with the Respiratory Therapy Staff  Yevonne Pax, MD [facility] Pulmonary Critical Care Medicine Sleep Medicine

## 2018-04-02 DIAGNOSIS — J690 Pneumonitis due to inhalation of food and vomit: Secondary | ICD-10-CM | POA: Diagnosis not present

## 2018-04-02 DIAGNOSIS — I482 Chronic atrial fibrillation: Secondary | ICD-10-CM | POA: Diagnosis not present

## 2018-04-02 DIAGNOSIS — J9621 Acute and chronic respiratory failure with hypoxia: Secondary | ICD-10-CM | POA: Diagnosis not present

## 2018-04-02 DIAGNOSIS — I2699 Other pulmonary embolism without acute cor pulmonale: Secondary | ICD-10-CM | POA: Diagnosis not present

## 2018-04-02 LAB — CBC
HEMATOCRIT: 27.8 % — AB (ref 39.0–52.0)
Hemoglobin: 9 g/dL — ABNORMAL LOW (ref 13.0–17.0)
MCH: 28.7 pg (ref 26.0–34.0)
MCHC: 32.4 g/dL (ref 30.0–36.0)
MCV: 88.5 fL (ref 78.0–100.0)
PLATELETS: 276 10*3/uL (ref 150–400)
RBC: 3.14 MIL/uL — ABNORMAL LOW (ref 4.22–5.81)
RDW: 16 % — ABNORMAL HIGH (ref 11.5–15.5)
WBC: 15.9 10*3/uL — AB (ref 4.0–10.5)

## 2018-04-02 LAB — RENAL FUNCTION PANEL
Albumin: 1.8 g/dL — ABNORMAL LOW (ref 3.5–5.0)
Anion gap: 9 (ref 5–15)
BUN: 65 mg/dL — AB (ref 6–20)
CHLORIDE: 105 mmol/L (ref 101–111)
CO2: 25 mmol/L (ref 22–32)
CREATININE: 3.63 mg/dL — AB (ref 0.61–1.24)
Calcium: 7.9 mg/dL — ABNORMAL LOW (ref 8.9–10.3)
GFR calc Af Amer: 18 mL/min — ABNORMAL LOW (ref >60)
GFR, EST NON AFRICAN AMERICAN: 16 mL/min — AB (ref >60)
Glucose, Bld: 141 mg/dL — ABNORMAL HIGH (ref 65–99)
POTASSIUM: 3.8 mmol/L (ref 3.5–5.1)
Phosphorus: 5.2 mg/dL — ABNORMAL HIGH (ref 2.5–4.6)
Sodium: 139 mmol/L (ref 135–145)

## 2018-04-02 LAB — HEPARIN LEVEL (UNFRACTIONATED)
HEPARIN UNFRACTIONATED: 0.29 [IU]/mL — AB (ref 0.30–0.70)
HEPARIN UNFRACTIONATED: 0.26 [IU]/mL — AB (ref 0.30–0.70)
HEPARIN UNFRACTIONATED: 0.29 [IU]/mL — AB (ref 0.30–0.70)

## 2018-04-02 LAB — PROTIME-INR
INR: 1.93
Prothrombin Time: 21.9 seconds — ABNORMAL HIGH (ref 11.4–15.2)

## 2018-04-02 LAB — MAGNESIUM: Magnesium: 2.1 mg/dL (ref 1.7–2.4)

## 2018-04-02 NOTE — Progress Notes (Signed)
[name]  ROUNDING NOTE   Subjective:  Patient sitting up in a chair. BUN and creatinine remain high at 65 and 3.63 respectively. Due for dialysis again tomorrow.  Objective:  Vital signs in last 24 hours:  Temperature nine 9.2 pulse 77 respirations 16 blood pressure 150/80  Physical Exam: General: No acute distress  Head: Normocephalic, atraumatic. Moist oral mucosal membranes  Eyes: Anicteric  Neck: Tracheostomy present  Lungs:  Scattered rhonchi, normal effort  Heart: S1S2 no rubs  Abdomen:  Soft, nontender, bowel sounds present, PEG present  Extremities: 2+ peripheral edema.  Neurologic: Awake, alert, not consistently following commands  Skin: No lesions  Access: Left internal jugular temporary dialysis catheter    Basic Metabolic Panel: Recent Labs  Lab 03/29/18 0731 03/30/18 0648 03/31/18 0707 04/02/18 0147  NA 139 141 139 139  K 4.9 5.2* 4.4 3.8  CL 110 111 107 105  CO2 17* 17* 22 25  GLUCOSE 98 114* 136* 141*  BUN 117* 126* 71* 65*  CREATININE 5.74* 5.73* 4.14* 3.63*  CALCIUM 7.8*  7.4* 8.0* 7.8* 7.9*  MG 2.7* 2.9* 2.3 2.1  PHOS 6.5* 7.6* 5.7* 5.2*    Liver Function Tests: Recent Labs  Lab 03/29/18 0731 03/30/18 0648 03/31/18 0707 04/02/18 0147  AST 114*  --   --   --   ALT 134*  --   --   --   ALKPHOS 139*  --   --   --   BILITOT 0.5  --   --   --   PROT 6.1*  --   --   --   ALBUMIN 1.5* 1.5* 1.7* 1.8*   No results for input(s): LIPASE, AMYLASE in the last 168 hours. No results for input(s): AMMONIA in the last 168 hours.  CBC: Recent Labs  Lab 03/29/18 0731 03/30/18 0648 03/31/18 0707 04/02/18 0147  WBC 16.7* 15.9* 15.5* 15.9*  NEUTROABS 13.4*  --   --   --   HGB 8.2* 8.6* 8.8* 9.0*  HCT 24.4* 25.8* 27.2* 27.8*  MCV 86.5 88.1 88.3 88.5  PLT 307 305 295 276    Cardiac Enzymes: No results for input(s): CKTOTAL, CKMB, CKMBINDEX, TROPONINI in the last 168 hours.  BNP: Invalid input(s): POCBNP  CBG: No results  for input(s): GLUCAP in the last 168 hours.  Microbiology: Results for orders placed or performed during the hospital encounter of 04/01/2018  C difficile quick scan w PCR reflex     Status: None   Collection Time: 04/07/2018  3:01 PM  Result Value Ref Range Status   C Diff antigen NEGATIVE NEGATIVE Final   C Diff toxin NEGATIVE NEGATIVE Final   C Diff interpretation No C. difficile detected.  Final    Comment: Performed at [facility], [address], [place], [place] [address]  Culture, Urine     Status: None   Collection Time: 03/29/18 10:45 PM  Result Value Ref Range Status   Specimen Description URINE, RANDOM  Final   Special Requests NONE  Final   Culture   Final    NO GROWTH Performed at [facility], [address], [place], [place] [address]    Report Status 03/31/2018 FINAL  Final  Culture, respiratory (NON-Expectorated)     Status: None   Collection Time: 03/30/18  8:55 AM  Result Value Ref Range Status   Specimen Description TRACHEAL ASPIRATE  Final   Special Requests NONE  Final   Gram Stain  Final    MODERATE WBC PRESENT, PREDOMINANTLY PMN MODERATE SQUAMOUS EPITHELIAL CELLS PRESENT FEW GRAM POSITIVE COCCI FEW GRAM POSITIVE RODS    Culture   Final    Consistent with normal respiratory flora. Performed at [facility], [address] [place], [place] [address]    Report Status 04/01/2018 FINAL  Final    Coagulation Studies: Recent Labs    03/31/18 0707 04/01/18 1327 04/02/18 0147  LABPROT 20.8* 22.4* 21.9*  INR 1.81 1.99 1.93    Urinalysis: No results for input(s): COLORURINE, LABSPEC, PHURINE, GLUCOSEU, HGBUR, BILIRUBINUR, KETONESUR, PROTEINUR, UROBILINOGEN, NITRITE, LEUKOCYTESUR in the last 72 hours.  Invalid input(s): APPERANCEUR    Imaging: No results found.   Medications:       Assessment/ Plan:  [name]  with a PMHx of diabetes mellitus type 2, osteoarthritis, who was admitted to [facility] on 04/03/2018 for ongoing treatment CVA with basilar occlusion on March 10, 2018 status post basilar artery angioplasty on March 10, 2018.  Patient initially presented outside hospital with facial droop as well as dysarthria.  During prior hospitalization he also developed acute respiratory failure and is status post tracheostomy placement as well as PEG tube placement.  In addition he had worsening renal function during the course of the hospitalization and temporary left internal jugular dialysis catheter was placed and he was initiated on dialysis immediately before transfer here.   1.  Acute renal failure secondary to ATN. 2.  Acute respiratory failure. 3.  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 4.  Anemia unspecified.  Plan: BUN and creatinine still trending high though improved with the initiation of dialysis.  We will plan for dialysis again tomorrow.  Thereafter we will transition the patient to dialysis on Wednesday as well.  Continue to monitor renal parameters as well as urine output trend.  Hemoglobin improved to 9.0.  Further plan as patient progresses.   LOS: 0 Curtis Hopkins 5/6/20193:22 PM

## 2018-04-02 NOTE — Progress Notes (Signed)
Pulmonary Critical Care Medicine [facility] [place]   PULMONARY SERVICE  PROGRESS NOTE  Date of Service: 04/02/2018  SHAFIQ LARCH  ZOX:[id number]  DOB: [birth year]   DOA: Apr 10, 2018  Referring Physician: Carron Curie, MD  HPI: Curtis Hopkins is a 72 y.o. male seen for follow up of Acute on Chronic Respiratory Failure.  Patient is weaning is on T collar has been ordered for more than 18 hours.  Medications: Reviewed on Rounds  Physical Exam:  Vitals: Temperature 99.1 pulse 79 respiratory rate 20 blood pressure one 5/75 saturations 91%  Ventilator Settings off the ventilator on T collar FiO2 20%  . General: Comfortable at this time . Eyes: Grossly normal lids, irises & conjunctiva . ENT: grossly tongue is normal . Neck: no obvious mass . Cardiovascular: S1-S2 normal no gallop or rub . Respiratory: No rhonchi expansion equal . Abdomen: Soft nontender . Skin: no rash seen on limited exam . Musculoskeletal: not rigid . Psychiatric:unable to assess . Neurologic: no seizure no involuntary movements         Labs on Admission:  Basic Metabolic Panel: Recent Labs  Lab 03/29/18 0731 03/30/18 0648 03/31/18 0707 04/02/18 0147  NA 139 141 139 139  K 4.9 5.2* 4.4 3.8  CL 110 111 107 105  CO2 17* 17* 22 25  GLUCOSE 98 114* 136* 141*  BUN 117* 126* 71* 65*  CREATININE 5.74* 5.73* 4.14* 3.63*  CALCIUM 7.8*  7.4* 8.0* 7.8* 7.9*  MG 2.7* 2.9* 2.3 2.1  PHOS 6.5* 7.6* 5.7* 5.2*    Liver Function Tests: Recent Labs  Lab 03/29/18 0731 03/30/18 0648 03/31/18 0707 04/02/18 0147  AST 114*  --   --   --   ALT 134*  --   --   --   ALKPHOS 139*  --   --   --   BILITOT 0.5  --   --   --   PROT 6.1*  --   --   --   ALBUMIN 1.5* 1.5* 1.7* 1.8*   No results for input(s): LIPASE, AMYLASE in the last 168 hours. No results for input(s): AMMONIA in the last 168 hours.  CBC: Recent Labs  Lab 03/29/18 0731 03/30/18 0648 03/31/18 0707 04/02/18 0147  WBC  16.7* 15.9* 15.5* 15.9*  NEUTROABS 13.4*  --   --   --   HGB 8.2* 8.6* 8.8* 9.0*  HCT 24.4* 25.8* 27.2* 27.8*  MCV 86.5 88.1 88.3 88.5  PLT 307 305 295 276    Cardiac Enzymes: No results for input(s): CKTOTAL, CKMB, CKMBINDEX, TROPONINI in the last 168 hours.  BNP (last 3 results) No results for input(s): BNP in the last 8760 hours.  ProBNP (last 3 results) No results for input(s): PROBNP in the last 8760 hours.  Radiological Exams on Admission: No results found.  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 Acute on chronic respiratory failure with hypoxia we will continue with T collar trials as ordered continue pulmonary toilet and secretion management 2. Chronic atrial fibrillation rate is controlled we will continue to monitor 3. 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 Pulmonary embolism at baseline 5. Aspiration pneumonia treated with antibiotics 6.  Severe sepsis with shock hemodynamically stable now   I have personally seen and evaluated the patient, evaluated laboratory and imaging results, formulated the assessment and plan and placed orders. The Patient requires high complexity decision making for assessment and support.  Case was discussed on Rounds with the Respiratory Therapy Staff  Yevonne Pax, MD [facility] Pulmonary Critical Care Medicine Sleep Medicine

## 2018-04-03 DIAGNOSIS — I2699 Other pulmonary embolism without acute cor pulmonale: Secondary | ICD-10-CM | POA: Diagnosis not present

## 2018-04-03 DIAGNOSIS — I482 Chronic atrial fibrillation: Secondary | ICD-10-CM | POA: Diagnosis not present

## 2018-04-03 DIAGNOSIS — J690 Pneumonitis due to inhalation of food and vomit: Secondary | ICD-10-CM | POA: Diagnosis not present

## 2018-04-03 DIAGNOSIS — J9621 Acute and chronic respiratory failure with hypoxia: Secondary | ICD-10-CM | POA: Diagnosis not present

## 2018-04-03 LAB — PROTIME-INR
INR: 1.55
Prothrombin Time: 18.4 seconds — ABNORMAL HIGH (ref 11.4–15.2)

## 2018-04-03 LAB — CBC
HCT: 28.6 % — ABNORMAL LOW (ref 39.0–52.0)
Hemoglobin: 9 g/dL — ABNORMAL LOW (ref 13.0–17.0)
MCH: 28.5 pg (ref 26.0–34.0)
MCHC: 31.5 g/dL (ref 30.0–36.0)
MCV: 90.5 fL (ref 78.0–100.0)
PLATELETS: 246 10*3/uL (ref 150–400)
RBC: 3.16 MIL/uL — AB (ref 4.22–5.81)
RDW: 16.8 % — ABNORMAL HIGH (ref 11.5–15.5)
WBC: 13.1 10*3/uL — AB (ref 4.0–10.5)

## 2018-04-03 LAB — RENAL FUNCTION PANEL
Albumin: 1.9 g/dL — ABNORMAL LOW (ref 3.5–5.0)
Anion gap: 10 (ref 5–15)
BUN: 75 mg/dL — ABNORMAL HIGH (ref 6–20)
CALCIUM: 8.3 mg/dL — AB (ref 8.9–10.3)
CHLORIDE: 106 mmol/L (ref 101–111)
CO2: 28 mmol/L (ref 22–32)
CREATININE: 3.61 mg/dL — AB (ref 0.61–1.24)
GFR, EST AFRICAN AMERICAN: 18 mL/min — AB (ref >60)
GFR, EST NON AFRICAN AMERICAN: 16 mL/min — AB (ref >60)
Glucose, Bld: 136 mg/dL — ABNORMAL HIGH (ref 65–99)
PHOSPHORUS: 5.5 mg/dL — AB (ref 2.5–4.6)
Potassium: 3.9 mmol/L (ref 3.5–5.1)
Sodium: 144 mmol/L (ref 135–145)

## 2018-04-03 LAB — HEPARIN LEVEL (UNFRACTIONATED): HEPARIN UNFRACTIONATED: 0.29 [IU]/mL — AB (ref 0.30–0.70)

## 2018-04-03 NOTE — Progress Notes (Signed)
Pulmonary Critical Care Medicine [facility] [place]   PULMONARY SERVICE  PROGRESS NOTE  Date of Service: 04/03/2018  Curtis Hopkins  JYN:[id number]  DOB: [birth year]   DOA: 04/21/2018  Referring Physician: Carron Curie, MD  HPI: Curtis Hopkins is a 72 y.o. male seen for follow up of Acute on Chronic Respiratory Failure.  Patient is on T collar trials rate and has been on 35% oxygen we are resting them on the ventilator at nighttime  Medications: Reviewed on Rounds  Physical Exam:  Vitals: Temperature nine 9.4 pulse 72 respiratory rate 12 blood pressure 143/76 saturations 98%  Ventilator Settings off of the ventilator on T collar  . General: Comfortable at this time . Eyes: Grossly normal lids, irises & conjunctiva . ENT: grossly tongue is normal . Neck: no obvious mass . Cardiovascular: S1-S2 normal no gallop rub . Respiratory: No rhonchi expansion equal . Abdomen: Soft nontender . Skin: no rash seen on limited exam . Musculoskeletal: not rigid . Psychiatric:unable to assess . Neurologic: no seizure no involuntary movements         Labs on Admission:  Basic Metabolic Panel: Recent Labs  Lab 03/29/18 0731 03/30/18 0648 03/31/18 0707 04/02/18 0147 04/03/18 0703  NA 139 141 139 139 144  K 4.9 5.2* 4.4 3.8 3.9  CL 110 111 107 105 106  CO2 17* 17* GLUCOSE 98 114* 136* 141* 136*  BUN 117* 126* 71* 65* 75*  CREATININE 5.74* 5.73* 4.14* 3.63* 3.61*  CALCIUM 7.8*  7.4* 8.0* 7.8* 7.9* 8.3*  MG 2.7* 2.9* 2.3 2.1  --   PHOS 6.5* 7.6* 5.7* 5.2* 5.5*    Liver Function Tests: Recent Labs  Lab 03/29/18 0731 03/30/18 0648 03/31/18 0707 04/02/18 0147 04/03/18 0703  AST 114*  --   --   --   --   ALT 134*  --   --   --   --   ALKPHOS 139*  --   --   --   --   BILITOT 0.5  --   --   --   --   PROT 6.1*  --   --   --   --   ALBUMIN 1.5* 1.5* 1.7* 1.8* 1.9*   No results for input(s): LIPASE, AMYLASE in the last 168 hours. No results for  input(s): AMMONIA in the last 168 hours.  CBC: Recent Labs  Lab 03/29/18 0731 03/30/18 0648 03/31/18 0707 04/02/18 0147 04/03/18 0703  WBC 16.7* 15.9* 15.5* 15.9* 13.1*  NEUTROABS 13.4*  --   --   --   --   HGB 8.2* 8.6* 8.8* 9.0* 9.0*  HCT 24.4* 25.8* 27.2* 27.8* 28.6*  MCV 86.5 88.1 88.3 88.5 90.5  PLT 307 305 295 276 246    Cardiac Enzymes: No results for input(s): CKTOTAL, CKMB, CKMBINDEX, TROPONINI in the last 168 hours.  BNP (last 3 results) No results for input(s): BNP in the last 8760 hours.  ProBNP (last 3 results) No results for input(s): PROBNP in the last 8760 hours.  Radiological Exams on Admission: No results found.  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 Acute on chronic respiratory failure with hypoxia we will continue to  wean on T collar trials titrate FiO2 as tolerated continue pulmonary toilet and secretion management 2. Chronic atrial fibrillation rate is controlled we will continue supportive care 3. Acute pulmonary embolism monitor hemodynamically stable 4. Stroke due to basilar artery continue with respiratory therapy 5. Pneumonia due to aspiration continue with supportive care treated with antibiotics 6. Severe sepsis and shock hemodynamically stable   I have personally seen and evaluated the patient, evaluated laboratory and imaging results, formulated the assessment and plan and placed orders. The Patient requires high complexity decision making for assessment and support.  Case was discussed on Rounds with the Respiratory Therapy Staff  Yevonne Pax, MD [facility] Pulmonary Critical Care Medicine Sleep Medicine

## 2018-04-04 DIAGNOSIS — J9621 Acute and chronic respiratory failure with hypoxia: Secondary | ICD-10-CM | POA: Diagnosis not present

## 2018-04-04 DIAGNOSIS — J690 Pneumonitis due to inhalation of food and vomit: Secondary | ICD-10-CM | POA: Diagnosis not present

## 2018-04-04 DIAGNOSIS — I2699 Other pulmonary embolism without acute cor pulmonale: Secondary | ICD-10-CM | POA: Diagnosis not present

## 2018-04-04 DIAGNOSIS — I482 Chronic atrial fibrillation: Secondary | ICD-10-CM | POA: Diagnosis not present

## 2018-04-04 LAB — RENAL FUNCTION PANEL
ALBUMIN: 2 g/dL — AB (ref 3.5–5.0)
ANION GAP: 12 (ref 5–15)
BUN: 79 mg/dL — ABNORMAL HIGH (ref 6–20)
CALCIUM: 8.3 mg/dL — AB (ref 8.9–10.3)
CO2: 27 mmol/L (ref 22–32)
CREATININE: 3.34 mg/dL — AB (ref 0.61–1.24)
Chloride: 106 mmol/L (ref 101–111)
GFR calc Af Amer: 20 mL/min — ABNORMAL LOW (ref >60)
GFR calc non Af Amer: 17 mL/min — ABNORMAL LOW (ref >60)
GLUCOSE: 133 mg/dL — AB (ref 65–99)
PHOSPHORUS: 4.9 mg/dL — AB (ref 2.5–4.6)
Potassium: 4 mmol/L (ref 3.5–5.1)
SODIUM: 145 mmol/L (ref 135–145)

## 2018-04-04 LAB — HEPARIN LEVEL (UNFRACTIONATED): Heparin Unfractionated: 0.37 IU/mL (ref 0.30–0.70)

## 2018-04-04 LAB — CBC
HCT: 29.3 % — ABNORMAL LOW (ref 39.0–52.0)
HEMOGLOBIN: 9.1 g/dL — AB (ref 13.0–17.0)
MCH: 28.3 pg (ref 26.0–34.0)
MCHC: 31.1 g/dL (ref 30.0–36.0)
MCV: 91.3 fL (ref 78.0–100.0)
PLATELETS: 258 10*3/uL (ref 150–400)
RBC: 3.21 MIL/uL — AB (ref 4.22–5.81)
RDW: 16.6 % — ABNORMAL HIGH (ref 11.5–15.5)
WBC: 11.8 10*3/uL — ABNORMAL HIGH (ref 4.0–10.5)

## 2018-04-04 LAB — PROTIME-INR
INR: 1.48
Prothrombin Time: 17.8 seconds — ABNORMAL HIGH (ref 11.4–15.2)

## 2018-04-04 NOTE — Progress Notes (Signed)
[name]  ROUNDING NOTE   Subjective:  Patient completed hemodialysis today. Tolerated well. Minimal ultrafiltration.  Objective:  Vital signs in last 24 hours:  Temperature 99.2 pulse 75 respiration 16 blood pressure 154/81  Physical Exam: General: No acute distress  Head: Normocephalic, atraumatic. Moist oral mucosal membranes  Eyes: Anicteric  Neck: Tracheostomy present  Lungs:  Scattered rhonchi, normal effort  Heart: S1S2 no rubs  Abdomen:  Soft, nontender, bowel sounds present, PEG present  Extremities: 1+ peripheral edema.  Neurologic: Awake, alert, not consistently following commands  Skin: No lesions  Access: Left internal jugular temporary dialysis catheter    Basic Metabolic Panel: Recent Labs  Lab 03/29/18 0731 03/30/18 0648 03/31/18 0707 04/02/18 0147 04/03/18 0703 04/04/18 0636  NA 139 141 139 139 144 145  K 4.9 5.2* 4.4 3.8 3.9 4.0  CL 110 111 107 105 106 106  CO2 17* 17* GLUCOSE 98 114* 136* 141* 136* 133*  BUN 117* 126* 71* 65* 75* 79*  CREATININE 5.74* 5.73* 4.14* 3.63* 3.61* 3.34*  CALCIUM 7.8*  7.4* 8.0* 7.8* 7.9* 8.3* 8.3*  MG 2.7* 2.9* 2.3 2.1  --   --   PHOS 6.5* 7.6* 5.7* 5.2* 5.5* 4.9*    Liver Function Tests: Recent Labs  Lab 03/29/18 0731 03/30/18 1610 03/31/18 0707 04/02/18 0147 04/03/18 0703 04/04/18 0636  AST 114*  --   --   --   --   --   ALT 134*  --   --   --   --   --   ALKPHOS 139*  --   --   --   --   --   BILITOT 0.5  --   --   --   --   --   PROT 6.1*  --   --   --   --   --   ALBUMIN 1.5* 1.5* 1.7* 1.8* 1.9* 2.0*   No results for input(s): LIPASE, AMYLASE in the last 168 hours. No results for input(s): AMMONIA in the last 168 hours.  CBC: Recent Labs  Lab 03/29/18 0731 03/30/18 0648 03/31/18 0707 04/02/18 0147 04/03/18 0703 04/04/18 0636  WBC 16.7* 15.9* 15.5* 15.9* 13.1* 11.8*  NEUTROABS 13.4*  --   --   --   --   --   HGB 8.2* 8.6* 8.8* 9.0* 9.0* 9.1*  HCT 24.4* 25.8*  27.2* 27.8* 28.6* 29.3*  MCV 86.5 88.1 88.3 88.5 90.5 91.3  PLT 307 305 295 276 246 258    Cardiac Enzymes: No results for input(s): CKTOTAL, CKMB, CKMBINDEX, TROPONINI in the last 168 hours.  BNP: Invalid input(s): POCBNP  CBG: No results for input(s): GLUCAP in the last 168 hours.  Microbiology: Results for orders placed or performed during the hospital encounter of 04/24/2018  C difficile quick scan w PCR reflex     Status: None   Collection Time: 04/27/2018  3:01 PM  Result Value Ref Range Status   C Diff antigen NEGATIVE NEGATIVE Final   C Diff toxin NEGATIVE NEGATIVE Final   C Diff interpretation No C. difficile detected.  Final    Comment: Performed at [facility], [address], [place], [place] [address]  Culture, Urine     Status: None   Collection Time: 03/29/18 10:45 PM  Result Value Ref Range Status   Specimen Description URINE, RANDOM  Final   Special Requests NONE  Final   Culture   Final  NO GROWTH Performed at [facility], [address], [place], [place] [address]    Report Status 03/31/2018 FINAL  Final  Culture, respiratory (NON-Expectorated)     Status: None   Collection Time: 03/30/18  8:55 AM  Result Value Ref Range Status   Specimen Description TRACHEAL ASPIRATE  Final   Special Requests NONE  Final   Gram Stain   Final    MODERATE WBC PRESENT, PREDOMINANTLY PMN MODERATE SQUAMOUS EPITHELIAL CELLS PRESENT FEW GRAM POSITIVE COCCI FEW GRAM POSITIVE RODS    Culture   Final    Consistent with normal respiratory flora. Performed at [facility], [address] [place], [place] [address]    Report Status 04/01/2018 FINAL  Final    Coagulation Studies: Recent Labs    04/02/18 0147 04/03/18 0703 04/04/18 0636  LABPROT 21.9* 18.4* 17.8*  INR 1.93 1.55 1.48    Urinalysis: No results for input(s): COLORURINE, LABSPEC, PHURINE, GLUCOSEU, HGBUR, BILIRUBINUR, KETONESUR, PROTEINUR, UROBILINOGEN, NITRITE, LEUKOCYTESUR in the  last 72 hours.  Invalid input(s): APPERANCEUR    Imaging: No results found.   Medications:       Assessment/ Plan:  [name]  with a PMHx of diabetes mellitus type 2, osteoarthritis, who was admitted to [facility] on 04/01/2018 for ongoing treatment CVA with basilar occlusion on March 10, 2018 status post basilar artery angioplasty on March 10, 2018.  Patient initially presented outside hospital with facial droop as well as dysarthria.  During prior hospitalization he also developed acute respiratory failure and is status post tracheostomy placement as well as PEG tube placement.  In addition he had worsening renal function during the course of the hospitalization and temporary left internal jugular dialysis catheter was placed and he was initiated on dialysis immediately before transfer here.   1.  Acute renal failure secondary to ATN. 2.  Acute respiratory failure. 3.  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 4.  Anemia unspecified.  Plan: Patient appears to have good urine output.  BUN currently 79 with a creatinine of 3.3.  Tentatively we will plan for dialysis again on Friday.  If he continues to have good urine output and BUN and creatinine started trending down we will likely consider cessation of dialysis.  Serum phosphorus down to 4.9.  Hemoglobin also appears to be stable at 9.1.  Further plan as patient progresses.   LOS: 0 Svetlana Bagby 5/8/20192:44 PM

## 2018-04-04 NOTE — Progress Notes (Signed)
Pulmonary Critical Care Medicine [facility] [place]   PULMONARY SERVICE  PROGRESS NOTE  Date of Service: 04/04/2018  Curtis Hopkins  VHQ:[id number]  DOB: [birth year]   DOA: 04/10/2018  Referring Physician: Carron Curie, MD  HPI: Curtis Hopkins is a 72 y.o. male seen for follow up of Acute on Chronic Respiratory Failure.  Patient continues to wean on her/T collar currently is on 35% oxygen.  Was able to do 18 hours of T collar and we are resting the patient on the ventilator at nighttime.  Medications: Reviewed on Rounds  Physical Exam:  Vitals: Temperature 98.6 pulse 80 respiratory rate 19 blood pressure 135/67 saturations 97%  Ventilator Settings on T collar at this time we will continue  . General: Comfortable at this time . Eyes: Grossly normal lids, irises & conjunctiva . ENT: grossly tongue is normal . Neck: no obvious mass . Cardiovascular: S1-S2 normal no gallop or rub . Respiratory: Scattered few distant rhonchi . Abdomen: Obese and soft . Skin: no rash seen on limited exam . Musculoskeletal: not rigid . Psychiatric:unable to assess . Neurologic: no seizure no involuntary movements         Labs on Admission:  Basic Metabolic Panel: Recent Labs  Lab 03/29/18 0731 03/30/18 0648 03/31/18 0707 04/02/18 0147 04/03/18 0703 04/04/18 0636  NA 139 141 139 139 144 145  K 4.9 5.2* 4.4 3.8 3.9 4.0  CL 110 111 107 105 106 106  CO2 17* 17* GLUCOSE 98 114* 136* 141* 136* 133*  BUN 117* 126* 71* 65* 75* 79*  CREATININE 5.74* 5.73* 4.14* 3.63* 3.61* 3.34*  CALCIUM 7.8*  7.4* 8.0* 7.8* 7.9* 8.3* 8.3*  MG 2.7* 2.9* 2.3 2.1  --   --   PHOS 6.5* 7.6* 5.7* 5.2* 5.5* 4.9*    Liver Function Tests: Recent Labs  Lab 03/29/18 0731 03/30/18 4132 03/31/18 0707 04/02/18 0147 04/03/18 0703 04/04/18 0636  AST 114*  --   --   --   --   --   ALT 134*  --   --   --   --   --   ALKPHOS 139*  --   --   --   --   --   BILITOT 0.5  --   --   --    --   --   PROT 6.1*  --   --   --   --   --   ALBUMIN 1.5* 1.5* 1.7* 1.8* 1.9* 2.0*   No results for input(s): LIPASE, AMYLASE in the last 168 hours. No results for input(s): AMMONIA in the last 168 hours.  CBC: Recent Labs  Lab 03/29/18 0731 03/30/18 0648 03/31/18 0707 04/02/18 0147 04/03/18 0703 04/04/18 0636  WBC 16.7* 15.9* 15.5* 15.9* 13.1* 11.8*  NEUTROABS 13.4*  --   --   --   --   --   HGB 8.2* 8.6* 8.8* 9.0* 9.0* 9.1*  HCT 24.4* 25.8* 27.2* 27.8* 28.6* 29.3*  MCV 86.5 88.1 88.3 88.5 90.5 91.3  PLT 307 305 295 276 246 258    Cardiac Enzymes: No results for input(s): CKTOTAL, CKMB, CKMBINDEX, TROPONINI in the last 168 hours.  BNP (last 3 results) No results for input(s): BNP in the last 8760 hours.  ProBNP (last 3 results) No results for input(s): PROBNP in the last 8760 hours.  Radiological Exams on Admission: No results found.  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 Acute on chronic respiratory failure with hypoxia we will continue with weaning on T collar as tolerated.  Advance time off the ventilator as tolerated right now is resting on the ventilator at nighttime. 2. Acute pulmonary embolism treated we will continue to follow 3. Stroke stable we will continue with restorative therapy 4. Severe sepsis with shock hemodynamically stable we will continue to follow 5. Chronic atrial fibrillation rate is controlled   I have personally seen and evaluated the patient, evaluated laboratory and imaging results, formulated the assessment and plan and placed orders. The Patient requires high complexity decision making for assessment and support.  Case was discussed on Rounds with the  Respiratory Therapy Staff  Yevonne Pax, MD [facility] Pulmonary Critical Care Medicine Sleep Medicine

## 2018-04-05 ENCOUNTER — Other Ambulatory Visit (HOSPITAL_COMMUNITY): Payer: Self-pay

## 2018-04-05 DIAGNOSIS — J9621 Acute and chronic respiratory failure with hypoxia: Secondary | ICD-10-CM | POA: Diagnosis not present

## 2018-04-05 DIAGNOSIS — I482 Chronic atrial fibrillation: Secondary | ICD-10-CM | POA: Diagnosis not present

## 2018-04-05 DIAGNOSIS — J690 Pneumonitis due to inhalation of food and vomit: Secondary | ICD-10-CM | POA: Diagnosis not present

## 2018-04-05 DIAGNOSIS — I2699 Other pulmonary embolism without acute cor pulmonale: Secondary | ICD-10-CM | POA: Diagnosis not present

## 2018-04-05 LAB — URINALYSIS, ROUTINE W REFLEX MICROSCOPIC
BACTERIA UA: NONE SEEN
BILIRUBIN URINE: NEGATIVE
Glucose, UA: NEGATIVE mg/dL
KETONES UR: NEGATIVE mg/dL
LEUKOCYTES UA: NEGATIVE
NITRITE: NEGATIVE
PROTEIN: 30 mg/dL — AB
Specific Gravity, Urine: 1.01 (ref 1.005–1.030)
pH: 6 (ref 5.0–8.0)

## 2018-04-05 LAB — CBC
HCT: 29 % — ABNORMAL LOW (ref 39.0–52.0)
HEMOGLOBIN: 9.2 g/dL — AB (ref 13.0–17.0)
MCH: 28.8 pg (ref 26.0–34.0)
MCHC: 31.7 g/dL (ref 30.0–36.0)
MCV: 90.6 fL (ref 78.0–100.0)
PLATELETS: 267 10*3/uL (ref 150–400)
RBC: 3.2 MIL/uL — AB (ref 4.22–5.81)
RDW: 16.1 % — ABNORMAL HIGH (ref 11.5–15.5)
WBC: 11.8 10*3/uL — AB (ref 4.0–10.5)

## 2018-04-05 LAB — PROTIME-INR
INR: 1.59
Prothrombin Time: 18.8 seconds — ABNORMAL HIGH (ref 11.4–15.2)

## 2018-04-05 LAB — HEPARIN LEVEL (UNFRACTIONATED)
HEPARIN UNFRACTIONATED: 0.14 [IU]/mL — AB (ref 0.30–0.70)
HEPARIN UNFRACTIONATED: 0.53 [IU]/mL (ref 0.30–0.70)
Heparin Unfractionated: 0.31 IU/mL (ref 0.30–0.70)

## 2018-04-05 NOTE — Progress Notes (Signed)
Pulmonary Critical Care Medicine [facility] [place]   PULMONARY SERVICE  PROGRESS NOTE  Date of Service: 04/05/2018  Curtis Hopkins  ZOX:[id number]  DOB: 09-14-1946   DOA: 04/09/2018  Referring Physician: Carron Curie, MD  HPI: Curtis Hopkins is a 72 y.o. male seen for follow up of Acute on Chronic Respiratory Failure.  Doing well remains on T collar trials.  Patient is resting on the ventilator at nighttime  Medications: Reviewed on Rounds  Physical Exam:  Vitals: Temperature 97.9 pulse 72 respiratory 14 blood pressure 145/77 saturations 96%  Ventilator Settings currently on aerosolized T collar FiO2 28% and a saturations of 96%  . General: Comfortable at this time . Eyes: Grossly normal lids, irises & conjunctiva . ENT: grossly tongue is normal . Neck: no obvious mass . Cardiovascular: S1-S2 normal no gallop or rub . Respiratory: No rhonchi at this time . Abdomen: Tender . Skin: no rash seen on limited exam . Musculoskeletal: not rigid . Psychiatric:unable to assess . Neurologic: no seizure no involuntary movements         Labs on Admission:  Basic Metabolic Panel: Recent Labs  Lab 03/30/18 0648 03/31/18 0707 04/02/18 0147 04/03/18 0703 04/04/18 0636  NA 141 139 139 144 145  K 5.2* 4.4 3.8 3.9 4.0  CL 111 107 105 106 106  CO2 17* GLUCOSE 114* 136* 141* 136* 133*  BUN 126* 71* 65* 75* 79*  CREATININE 5.73* 4.14* 3.63* 3.61* 3.34*  CALCIUM 8.0* 7.8* 7.9* 8.3* 8.3*  MG 2.9* 2.3 2.1  --   --   PHOS 7.6* 5.7* 5.2* 5.5* 4.9*    Liver Function Tests: Recent Labs  Lab 03/30/18 8119 03/31/18 0707 04/02/18 0147 04/03/18 0703 04/04/18 0636  ALBUMIN 1.5* 1.7* 1.8* 1.9* 2.0*   No results for input(s): LIPASE, AMYLASE in the last 168 hours. No results for input(s): AMMONIA in the last 168 hours.  CBC: Recent Labs  Lab 03/30/18 0648 03/31/18 0707 04/02/18 0147 04/03/18 0703 04/04/18 0636  WBC 15.9* 15.5* 15.9* 13.1*  11.8*  HGB 8.6* 8.8* 9.0* 9.0* 9.1*  HCT 25.8* 27.2* 27.8* 28.6* 29.3*  MCV 88.1 88.3 88.5 90.5 91.3  PLT 305 295 276 246 258    Cardiac Enzymes: No results for input(s): CKTOTAL, CKMB, CKMBINDEX, TROPONINI in the last 168 hours.  BNP (last 3 results) No results for input(s): BNP in the last 8760 hours.  ProBNP (last 3 results) No results for input(s): PROBNP in the last 8760 hours.  Radiological Exams on Admission: No results found.  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 Acute on chronic respiratory failure with hypoxia we will continue with weaning on T collar during the daytime rest on the ventilator at nighttime.  Continue with aggressive pulmonary toilet secretion management supportive care 2. Pneumonia due to aspiration stable at this time treated with antibiotics we will continue to follow 3. Severe sepsis with shock clinically resolved 4. Chronic atrial fibrillation rate is controlled we will continue to follow 5. Pulmonary embolism treated we will continue supportive care 6. Stroke continue with restorative therapy   I have personally seen and evaluated the patient, evaluated laboratory and imaging results, formulated the assessment  and plan and placed orders. The Patient requires high complexity decision making for assessment and support.  Case was discussed on Rounds with the Respiratory Therapy Staff  Allyne Gee, MD [facility] Pulmonary Critical Care Medicine Sleep Medicine

## 2018-04-06 DIAGNOSIS — I6322 Cerebral infarction due to unspecified occlusion or stenosis of basilar arteries: Secondary | ICD-10-CM | POA: Diagnosis not present

## 2018-04-06 DIAGNOSIS — I2699 Other pulmonary embolism without acute cor pulmonale: Secondary | ICD-10-CM | POA: Diagnosis not present

## 2018-04-06 DIAGNOSIS — R6521 Severe sepsis with septic shock: Secondary | ICD-10-CM | POA: Diagnosis not present

## 2018-04-06 DIAGNOSIS — I482 Chronic atrial fibrillation: Secondary | ICD-10-CM | POA: Diagnosis not present

## 2018-04-06 DIAGNOSIS — J9621 Acute and chronic respiratory failure with hypoxia: Secondary | ICD-10-CM | POA: Diagnosis not present

## 2018-04-06 DIAGNOSIS — J690 Pneumonitis due to inhalation of food and vomit: Secondary | ICD-10-CM | POA: Diagnosis not present

## 2018-04-06 DIAGNOSIS — A419 Sepsis, unspecified organism: Secondary | ICD-10-CM | POA: Diagnosis not present

## 2018-04-06 DIAGNOSIS — J189 Pneumonia, unspecified organism: Secondary | ICD-10-CM | POA: Diagnosis not present

## 2018-04-06 DIAGNOSIS — N170 Acute kidney failure with tubular necrosis: Secondary | ICD-10-CM | POA: Diagnosis not present

## 2018-04-06 LAB — RENAL FUNCTION PANEL
Albumin: 2 g/dL — ABNORMAL LOW (ref 3.5–5.0)
Anion gap: 9 (ref 5–15)
BUN: 68 mg/dL — ABNORMAL HIGH (ref 6–20)
CALCIUM: 8.3 mg/dL — AB (ref 8.9–10.3)
CO2: 29 mmol/L (ref 22–32)
CREATININE: 3.07 mg/dL — AB (ref 0.61–1.24)
Chloride: 106 mmol/L (ref 101–111)
GFR calc Af Amer: 22 mL/min — ABNORMAL LOW (ref >60)
GFR calc non Af Amer: 19 mL/min — ABNORMAL LOW (ref >60)
Glucose, Bld: 131 mg/dL — ABNORMAL HIGH (ref 65–99)
PHOSPHORUS: 4.3 mg/dL (ref 2.5–4.6)
Potassium: 3.9 mmol/L (ref 3.5–5.1)
Sodium: 144 mmol/L (ref 135–145)

## 2018-04-06 LAB — PROTIME-INR
INR: 1.7
INR: 1.76
PROTHROMBIN TIME: 19.9 s — AB (ref 11.4–15.2)
Prothrombin Time: 20.4 seconds — ABNORMAL HIGH (ref 11.4–15.2)

## 2018-04-06 LAB — CBC
HCT: 30 % — ABNORMAL LOW (ref 39.0–52.0)
Hemoglobin: 9.4 g/dL — ABNORMAL LOW (ref 13.0–17.0)
MCH: 28.9 pg (ref 26.0–34.0)
MCHC: 31.3 g/dL (ref 30.0–36.0)
MCV: 92.3 fL (ref 78.0–100.0)
Platelets: 279 10*3/uL (ref 150–400)
RBC: 3.25 MIL/uL — ABNORMAL LOW (ref 4.22–5.81)
RDW: 16.5 % — AB (ref 11.5–15.5)
WBC: 12 10*3/uL — ABNORMAL HIGH (ref 4.0–10.5)

## 2018-04-06 LAB — HEPARIN LEVEL (UNFRACTIONATED)
HEPARIN UNFRACTIONATED: 0.29 [IU]/mL — AB (ref 0.30–0.70)
Heparin Unfractionated: 0.23 IU/mL — ABNORMAL LOW (ref 0.30–0.70)
Heparin Unfractionated: 0.47 IU/mL (ref 0.30–0.70)

## 2018-04-06 LAB — URINE CULTURE: CULTURE: NO GROWTH

## 2018-04-06 NOTE — Progress Notes (Signed)
Pulmonary Critical Care Medicine [facility] [place]   PULMONARY SERVICE  PROGRESS NOTE  Date of Service: 04/06/2018  Curtis Hopkins  WUJ:[id number]  DOB: [birth year]   DOA: 04/24/2018  Referring Physician: Carron Curie, MD  HPI: Curtis Hopkins is a 72 y.o. male seen for follow up of Acute on Chronic Respiratory Failure.  Patient remains on T collar was supposed to go 18 hours doing well so far.  Resting on the ventilator at nighttime for 6 hours  Medications: Reviewed on Rounds  Physical Exam:  Vitals: Temperature 99.4 pulse 82 respiratory pressure 143/71 saturations 98%  Ventilator Settings T collar trials  . General: Comfortable at this time . Eyes: Grossly normal lids, irises & conjunctiva . ENT: grossly tongue is normal . Neck: no obvious mass . Cardiovascular: S1-S2 normal no gallop or rub . Respiratory: No rhonchi expansion is equal . Abdomen: Soft nontender . Skin: no rash seen on limited exam . Musculoskeletal: not rigid . Psychiatric:unable to assess . Neurologic: no seizure no involuntary movements         Labs on Admission:  Basic Metabolic Panel: Recent Labs  Lab 03/31/18 0707 04/02/18 0147 04/03/18 0703 04/04/18 0636 04/06/18 0006  NA 139 139 144 145 144  K 4.4 3.8 3.9 4.0 3.9  CL 107 105 106 106 106  CO2 GLUCOSE 136* 141* 136* 133* 131*  BUN 71* 65* 75* 79* 68*  CREATININE 4.14* 3.63* 3.61* 3.34* 3.07*  CALCIUM 7.8* 7.9* 8.3* 8.3* 8.3*  MG 2.3 2.1  --   --   --   PHOS 5.7* 5.2* 5.5* 4.9* 4.3    Liver Function Tests: Recent Labs  Lab 03/31/18 0707 04/02/18 0147 04/03/18 0703 04/04/18 0636 04/06/18 0006  ALBUMIN 1.7* 1.8* 1.9* 2.0* 2.0*   No results for input(s): LIPASE, AMYLASE in the last 168 hours. No results for input(s): AMMONIA in the last 168 hours.  CBC: Recent Labs  Lab 04/02/18 0147 04/03/18 0703 04/04/18 0636 04/05/18 1531 04/06/18 0006  WBC 15.9* 13.1* 11.8* 11.8* 12.0*  HGB 9.0*  9.0* 9.1* 9.2* 9.4*  HCT 27.8* 28.6* 29.3* 29.0* 30.0*  MCV 88.5 90.5 91.3 90.6 92.3  PLT 276 246 258 267 279    Cardiac Enzymes: No results for input(s): CKTOTAL, CKMB, CKMBINDEX, TROPONINI in the last 168 hours.  BNP (last 3 results) No results for input(s): BNP in the last 8760 hours.  ProBNP (last 3 results) No results for input(s): PROBNP in the last 8760 hours.  Radiological Exams on Admission: Dg Chest Port 1 View  Result Date: 04/05/2018 CLINICAL DATA:  Pneumonia EXAM: PORTABLE CHEST 1 VIEW COMPARISON:  Portable exam 1505 hours compared to 04/08/2018 FINDINGS: Tracheostomy tube and LEFT jugular line stable. Borderline enlargement of cardiac silhouette. Improved bibasilar infiltrates. Upper lungs clear. Probable tiny pleural effusions. No pneumothorax. IMPRESSION: Improved bibasilar infiltrates. Electronically Signed   By: Ulyses Southward M.D.   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 Acute on chronic respiratory failure with hypoxia we will continue with weaning on T collar trials as ordered.  Continue pulmonary toilet secretion management.  Prognosis overall guarded 2. Chronic atrial fibrillation rate  is controlled we will continue on present management 3. Acute coronary embolism clinically stable 4. Pneumonia due to aspiration continue present management 5. Severe sepsis with shock hemodynamically stable   I have personally seen and evaluated the patient, evaluated laboratory and imaging results, formulated the assessment and plan and placed orders. The Patient requires high complexity decision making for assessment and support.  Case was discussed on Rounds with the Respiratory  Therapy Staff  Yevonne Pax, MD [facility] Pulmonary Critical Care Medicine Sleep Medicine

## 2018-04-06 NOTE — Progress Notes (Signed)
[name]  ROUNDING NOTE   Subjective:  Patient resting comfortably in bed at the moment. He underwent hemodialysis today.   Objective:  Vital signs in last 24 hours:  Temperature 99 pulse 73 respirations 20 blood pressure 147/74  Physical Exam: General: No acute distress  Head: Normocephalic, atraumatic. Moist oral mucosal membranes  Eyes: Anicteric  Neck: Tracheostomy present  Lungs:  Scattered rhonchi, normal effort  Heart: S1S2 no rubs  Abdomen:  Soft, nontender, bowel sounds present, PEG present  Extremities: 1+ peripheral edema.  Neurologic: Awake, alert, not following commands  Skin: No lesions  Access: Left internal jugular temporary dialysis catheter    Basic Metabolic Panel: Recent Labs  Lab 03/31/18 0707 04/02/18 0147 04/03/18 0703 04/04/18 0636 04/06/18 0006  NA 139 139 144 145 144  K 4.4 3.8 3.9 4.0 3.9  CL 107 105 106 106 106  CO2 GLUCOSE 136* 141* 136* 133* 131*  BUN 71* 65* 75* 79* 68*  CREATININE 4.14* 3.63* 3.61* 3.34* 3.07*  CALCIUM 7.8* 7.9* 8.3* 8.3* 8.3*  MG 2.3 2.1  --   --   --   PHOS 5.7* 5.2* 5.5* 4.9* 4.3    Liver Function Tests: Recent Labs  Lab 03/31/18 0707 04/02/18 0147 04/03/18 0703 04/04/18 0636 04/06/18 0006  ALBUMIN 1.7* 1.8* 1.9* 2.0* 2.0*   No results for input(s): LIPASE, AMYLASE in the last 168 hours. No results for input(s): AMMONIA in the last 168 hours.  CBC: Recent Labs  Lab 04/02/18 0147 04/03/18 0703 04/04/18 0636 04/05/18 1531 04/06/18 0006  WBC 15.9* 13.1* 11.8* 11.8* 12.0*  HGB 9.0* 9.0* 9.1* 9.2* 9.4*  HCT 27.8* 28.6* 29.3* 29.0* 30.0*  MCV 88.5 90.5 91.3 90.6 92.3  PLT 276 246 258 267 279    Cardiac Enzymes: No results for input(s): CKTOTAL, CKMB, CKMBINDEX, TROPONINI in the last 168 hours.  BNP: Invalid input(s): POCBNP  CBG: No results for input(s): GLUCAP in the last 168 hours.  Microbiology: Results for orders placed or performed during the hospital  encounter of 04/04/18  C difficile quick scan w PCR reflex     Status: None   Collection Time: 04-Apr-2018  3:01 PM  Result Value Ref Range Status   C Diff antigen NEGATIVE NEGATIVE Final   C Diff toxin NEGATIVE NEGATIVE Final   C Diff interpretation No C. difficile detected.  Final    Comment: Performed at [facility], [address] [place], [place] [address]  Culture, Urine     Status: None   Collection Time: 03/29/18 10:45 PM  Result Value Ref Range Status   Specimen Description URINE, RANDOM  Final   Special Requests NONE  Final   Culture   Final    NO GROWTH Performed at [facility], [address], [place], [place] [address]    Report Status 03/31/2018 FINAL  Final  Culture, respiratory (NON-Expectorated)     Status: None   Collection Time: 03/30/18  8:55 AM  Result Value Ref Range Status   Specimen Description TRACHEAL ASPIRATE  Final   Special Requests NONE  Final   Gram Stain   Final    MODERATE WBC PRESENT, PREDOMINANTLY PMN MODERATE SQUAMOUS EPITHELIAL CELLS PRESENT FEW GRAM POSITIVE COCCI FEW GRAM POSITIVE RODS    Culture   Final    Consistent with normal respiratory flora. Performed at [facility], [address], [place], [place] [address]    Report Status 04/01/2018  FINAL  Final  Culture, respiratory (NON-Expectorated)     Status: None (Preliminary result)   Collection Time: 04/05/18  4:14 PM  Result Value Ref Range Status   Specimen Description TRACHEAL ASPIRATE  Final   Special Requests NONE  Final   Gram Stain   Final    ABUNDANT WBC PRESENT, PREDOMINANTLY PMN MODERATE GRAM POSITIVE COCCI MODERATE GRAM VARIABLE ROD    Culture   Final    TOO YOUNG TO READ Performed at [facility], [address], [place], [place] [address]    Report Status PENDING  Incomplete  Culture, Urine     Status: None   Collection Time: 04/05/18  4:45 PM  Result Value Ref Range Status   Specimen Description URINE, RANDOM  Final   Special Requests  NONE  Final   Culture   Final    NO GROWTH Performed at [facility], [address] [place], [place] [address]    Report Status 04/06/2018 FINAL  Final    Coagulation Studies: Recent Labs    04/04/18 0636 04/05/18 0625 04/06/18 0006 04/06/18 0623  LABPROT 17.8* 18.8* 19.9* 20.4*  INR 1.48 1.59 1.70 1.76    Urinalysis: Recent Labs    04/05/18 1645  COLORURINE YELLOW  LABSPEC 1.010  PHURINE 6.0  GLUCOSEU NEGATIVE  HGBUR LARGE*  BILIRUBINUR NEGATIVE  KETONESUR NEGATIVE  PROTEINUR 30*  NITRITE NEGATIVE  LEUKOCYTESUR NEGATIVE      Imaging: Dg Chest Port 1 View  Result Date: 04/05/2018 CLINICAL DATA:  Pneumonia EXAM: PORTABLE CHEST 1 VIEW COMPARISON:  Portable exam 1505 hours compared to 04/10/2018 FINDINGS: Tracheostomy tube and LEFT jugular line stable. Borderline enlargement of cardiac silhouette. Improved bibasilar infiltrates. Upper lungs clear. Probable tiny pleural effusions. No pneumothorax. IMPRESSION: Improved bibasilar infiltrates. Electronically Signed   By: Ulyses Southward M.D.   On: 04/05/2018 16:31     Medications:       Assessment/ Plan:  [name]  with a PMHx of diabetes mellitus type 2, osteoarthritis, who was admitted to [facility] on 04/19/2018 for ongoing treatment CVA with basilar occlusion on March 10, 2018 status post basilar artery angioplasty on March 10, 2018.  Patient initially presented outside hospital with facial droop as well as dysarthria.  During prior hospitalization he also developed acute respiratory failure and is status post tracheostomy placement as well as PEG tube placement.  In addition he had worsening renal function during the course of the hospitalization and temporary left internal jugular dialysis catheter was placed and he was initiated on dialysis immediately before transfer here.   1.  Acute renal failure secondary to ATN. 2.  Acute respiratory failure. 3.  CVA secondary to basilar artery  thrombosis. 4.  Anemia unspecified.  Plan: Patient continues to have significant urine output however he also has elevated BUN and creatinine.  Therefore we will maintain the patient on hemodialysis for now.  We will plan for dialysis again on Monday without ultrafiltration.  Hemoglobin appears to be stable at 9.4.  However his mental status does not appear to be significantly improved.  Today he was not following commands but was awake.  Further management per hospitalist.   LOS: 0 Curtis Hopkins 5/10/20195:11 PM

## 2018-04-07 ENCOUNTER — Encounter: Payer: Self-pay | Admitting: Internal Medicine

## 2018-04-07 DIAGNOSIS — J690 Pneumonitis due to inhalation of food and vomit: Secondary | ICD-10-CM | POA: Diagnosis not present

## 2018-04-07 DIAGNOSIS — N170 Acute kidney failure with tubular necrosis: Secondary | ICD-10-CM | POA: Diagnosis not present

## 2018-04-07 DIAGNOSIS — I482 Chronic atrial fibrillation: Secondary | ICD-10-CM | POA: Diagnosis not present

## 2018-04-07 DIAGNOSIS — J9621 Acute and chronic respiratory failure with hypoxia: Secondary | ICD-10-CM | POA: Diagnosis not present

## 2018-04-07 HISTORY — DX: Acute kidney failure with tubular necrosis: N17.0

## 2018-04-07 LAB — BLOOD CULTURE ID PANEL (REFLEXED)
ACINETOBACTER BAUMANNII: NOT DETECTED
CANDIDA GLABRATA: NOT DETECTED
CANDIDA KRUSEI: NOT DETECTED
CANDIDA PARAPSILOSIS: NOT DETECTED
Candida albicans: NOT DETECTED
Candida tropicalis: NOT DETECTED
ESCHERICHIA COLI: NOT DETECTED
Enterobacter cloacae complex: NOT DETECTED
Enterobacteriaceae species: NOT DETECTED
Enterococcus species: NOT DETECTED
Haemophilus influenzae: NOT DETECTED
KLEBSIELLA OXYTOCA: NOT DETECTED
Klebsiella pneumoniae: NOT DETECTED
Listeria monocytogenes: NOT DETECTED
Methicillin resistance: DETECTED — AB
Neisseria meningitidis: NOT DETECTED
PSEUDOMONAS AERUGINOSA: NOT DETECTED
Proteus species: NOT DETECTED
STAPHYLOCOCCUS AUREUS BCID: NOT DETECTED
STREPTOCOCCUS PNEUMONIAE: NOT DETECTED
STREPTOCOCCUS PYOGENES: NOT DETECTED
Serratia marcescens: NOT DETECTED
Staphylococcus species: DETECTED — AB
Streptococcus agalactiae: NOT DETECTED
Streptococcus species: NOT DETECTED

## 2018-04-07 LAB — HEPARIN LEVEL (UNFRACTIONATED)
HEPARIN UNFRACTIONATED: 0.13 [IU]/mL — AB (ref 0.30–0.70)
Heparin Unfractionated: 0.43 IU/mL (ref 0.30–0.70)

## 2018-04-07 LAB — PROTIME-INR
INR: 1.95
PROTHROMBIN TIME: 22.1 s — AB (ref 11.4–15.2)

## 2018-04-07 LAB — CULTURE, RESPIRATORY W GRAM STAIN: Culture: NORMAL

## 2018-04-07 LAB — CULTURE, RESPIRATORY

## 2018-04-07 NOTE — Progress Notes (Signed)
Pulmonary Critical Care Medicine [facility] [place]   PULMONARY SERVICE  PROGRESS NOTE  Date of Service: 04/07/2018  Curtis Hopkins  ZOX:[id number]  DOB: 1946-04-28   DOA: 10-Apr-2018  Referring Physician: Carron Curie, MD  HPI: Curtis Hopkins is a 72 y.o. male seen for follow up of Acute on Chronic Respiratory Failure.  Currently on T collar trials patient is comfortable right now patient's been on 20% oxygen doing 18 hours off the ventilator  Medications: Reviewed on Rounds  Physical Exam:  Vitals: Temperature 98.3 pulse 71 respiratory rate 13 blood pressure 140/77 saturations 95%  Ventilator Settings off of the ventilator on T collar with 28% FiO2  . General: Comfortable at this time . Eyes: Grossly normal lids, irises & conjunctiva . ENT: grossly tongue is normal . Neck: no obvious mass . Cardiovascular: S1-S2 normal no gallop or rub . Respiratory: No rhonchi expansion is equal . Abdomen: Soft nontender . Skin: no rash seen on limited exam . Musculoskeletal: not rigid . Psychiatric:unable to assess . Neurologic: no seizure no involuntary movements         Labs on Admission:  Basic Metabolic Panel: Recent Labs  Lab 04/02/18 0147 04/03/18 0703 04/04/18 0636 04/06/18 0006  NA 139 144 145 144  K 3.8 3.9 4.0 3.9  CL 105 106 106 106  CO2 GLUCOSE 141* 136* 133* 131*  BUN 65* 75* 79* 68*  CREATININE 3.63* 3.61* 3.34* 3.07*  CALCIUM 7.9* 8.3* 8.3* 8.3*  MG 2.1  --   --   --   PHOS 5.2* 5.5* 4.9* 4.3    Liver Function Tests: Recent Labs  Lab 04/02/18 0147 04/03/18 0703 04/04/18 0636 04/06/18 0006  ALBUMIN 1.8* 1.9* 2.0* 2.0*   No results for input(s): LIPASE, AMYLASE in the last 168 hours. No results for input(s): AMMONIA in the last 168 hours.  CBC: Recent Labs  Lab 04/02/18 0147 04/03/18 0703 04/04/18 0636 04/05/18 1531 04/06/18 0006  WBC 15.9* 13.1* 11.8* 11.8* 12.0*  HGB 9.0* 9.0* 9.1* 9.2* 9.4*  HCT 27.8*  28.6* 29.3* 29.0* 30.0*  MCV 88.5 90.5 91.3 90.6 92.3  PLT 276 246 258 267 279    Cardiac Enzymes: No results for input(s): CKTOTAL, CKMB, CKMBINDEX, TROPONINI in the last 168 hours.  BNP (last 3 results) No results for input(s): BNP in the last 8760 hours.  ProBNP (last 3 results) No results for input(s): PROBNP in the last 8760 hours.  Radiological Exams on Admission: Dg Chest Port 1 View  Result Date: 04/05/2018 CLINICAL DATA:  Pneumonia EXAM: PORTABLE CHEST 1 VIEW COMPARISON:  Portable exam 1505 hours compared to 04/10/2018 FINDINGS: Tracheostomy tube and LEFT jugular line stable. Borderline enlargement of cardiac silhouette. Improved bibasilar infiltrates. Upper lungs clear. Probable tiny pleural effusions. No pneumothorax. IMPRESSION: Improved bibasilar infiltrates. Electronically Signed   By: Ulyses Southward M.D.   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 Acute on chronic respiratory failure with hypoxia we will continue with T collar trials continue pulmonary toilet supportive care rest on the ventilator at nighttime. 2. Chronic atrial fibrillation rate is controlled we will follow  along 3. Pneumonia due to aspiration stable at this time we will continue to monitor 4. Severe sepsis with shock hemodynamically stable right now 5. Acute renal failure patient is followed by nephrology undergoing dialysis   I have personally seen and evaluated the patient, evaluated laboratory and imaging results, formulated the assessment and plan and placed orders. The Patient requires high complexity decision making for assessment and support.  Case was discussed on  Rounds with the Respiratory Therapy Staff  Yevonne Pax, MD [facility] Pulmonary Critical Care Medicine Sleep Medicine

## 2018-04-08 DIAGNOSIS — J9621 Acute and chronic respiratory failure with hypoxia: Secondary | ICD-10-CM | POA: Diagnosis not present

## 2018-04-08 DIAGNOSIS — A419 Sepsis, unspecified organism: Secondary | ICD-10-CM | POA: Diagnosis not present

## 2018-04-08 DIAGNOSIS — I482 Chronic atrial fibrillation: Secondary | ICD-10-CM | POA: Diagnosis not present

## 2018-04-08 DIAGNOSIS — N170 Acute kidney failure with tubular necrosis: Secondary | ICD-10-CM | POA: Diagnosis not present

## 2018-04-08 LAB — HEPARIN LEVEL (UNFRACTIONATED)
HEPARIN UNFRACTIONATED: 0.16 [IU]/mL — AB (ref 0.30–0.70)
HEPARIN UNFRACTIONATED: 0.31 [IU]/mL (ref 0.30–0.70)
HEPARIN UNFRACTIONATED: 0.39 [IU]/mL (ref 0.30–0.70)

## 2018-04-08 LAB — PROTIME-INR
INR: 2.2
Prothrombin Time: 24.2 seconds — ABNORMAL HIGH (ref 11.4–15.2)

## 2018-04-08 NOTE — Progress Notes (Signed)
Pulmonary Critical Care Medicine [facility] [place]   PULMONARY SERVICE  PROGRESS NOTE  Date of Service: 04/08/2018  KORBYN Hopkins  ZOX:[id number]  DOB: 03/20/1946   DOA: 04/25/2018  Referring Physician: Carron Curie, MD  HPI: Curtis Hopkins is a 72 y.o. male seen for follow up of Acute on Chronic Respiratory Failure.  Continues to wean on T collar currently is on 35% oxygen.  Good saturations are noted secretions are fair to moderate  Medications: Reviewed on Rounds  Physical Exam:  Vitals: Temperature 98.9 pulse 69 respiratory rate 18 blood pressure 142/73 saturations 98%  Ventilator Settings off the ventilator on T collar  . General: Comfortable at this time . Eyes: Grossly normal lids, irises & conjunctiva . ENT: grossly tongue is normal . Neck: no obvious mass . Cardiovascular: S1-S2 normal no gallop or rub . Respiratory: No rhonchi expansion is equal . Abdomen: Soft nontender . Skin: no rash seen on limited exam . Musculoskeletal: not rigid . Psychiatric:unable to assess . Neurologic: no seizure no involuntary movements         Labs on Admission:  Basic Metabolic Panel: Recent Labs  Lab 04/02/18 0147 04/03/18 0703 04/04/18 0636 04/06/18 0006  NA 139 144 145 144  K 3.8 3.9 4.0 3.9  CL 105 106 106 106  CO2 GLUCOSE 141* 136* 133* 131*  BUN 65* 75* 79* 68*  CREATININE 3.63* 3.61* 3.34* 3.07*  CALCIUM 7.9* 8.3* 8.3* 8.3*  MG 2.1  --   --   --   PHOS 5.2* 5.5* 4.9* 4.3    Liver Function Tests: Recent Labs  Lab 04/02/18 0147 04/03/18 0703 04/04/18 0636 04/06/18 0006  ALBUMIN 1.8* 1.9* 2.0* 2.0*   No results for input(s): LIPASE, AMYLASE in the last 168 hours. No results for input(s): AMMONIA in the last 168 hours.  CBC: Recent Labs  Lab 04/02/18 0147 04/03/18 0703 04/04/18 0636 04/05/18 1531 04/06/18 0006  WBC 15.9* 13.1* 11.8* 11.8* 12.0*  HGB 9.0* 9.0* 9.1* 9.2* 9.4*  HCT 27.8* 28.6* 29.3* 29.0* 30.0*   MCV 88.5 90.5 91.3 90.6 92.3  PLT 276 246 258 267 279    Cardiac Enzymes: No results for input(s): CKTOTAL, CKMB, CKMBINDEX, TROPONINI in the last 168 hours.  BNP (last 3 results) No results for input(s): BNP in the last 8760 hours.  ProBNP (last 3 results) No results for input(s): PROBNP in the last 8760 hours.  Radiological Exams on Admission: Dg Chest Port 1 View  Result Date: 04/05/2018 CLINICAL DATA:  Pneumonia EXAM: PORTABLE CHEST 1 VIEW COMPARISON:  Portable exam 1505 hours compared to 04/14/2018 FINDINGS: Tracheostomy tube and LEFT jugular line stable. Borderline enlargement of cardiac silhouette. Improved bibasilar infiltrates. Upper lungs clear. Probable tiny pleural effusions. No pneumothorax. IMPRESSION: Improved bibasilar infiltrates. Electronically Signed   By: Ulyses Southward M.D.   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 Acute on chronic respiratory failure with hypoxia we will continue with weaning on T collar as tolerated continue with aggressive pulmonary toilet patient will be rested on the ventilator at nighttime. 2. Chronic atrial fibrillation rate is controlled we  will follow along 3. Acute tubular necrosis followed by nephrology dialyze as necessary patient is to have a dialysis catheter placed 4. Pneumonia due to aspiration treated with antibiotics we will continue supportive care 5. Stroke secondary to basilar artery occlusion at baseline continue with restorative therapy 6. Severe sepsis and shock resolved hemodynamically stable   I have personally seen and evaluated the patient, evaluated laboratory and imaging results,  formulated the assessment and plan and placed orders. The Patient requires high complexity decision making for assessment and support.  Case was discussed on Rounds with the Respiratory Therapy Staff  Yevonne Pax, MD [facility] Pulmonary Critical Care Medicine Sleep Medicine

## 2018-04-08 NOTE — Progress Notes (Signed)
   Patient Status: [facility]  Assessment and Plan: Patient in need of venous access.   Temporary dialysis catheter placement  ______________________________________________________________________   History of Present Illness: Curtis Hopkins is a 72 y.o. male   CVA; Vent/trach Resp failure Renal failure New +BC Temp cath removed yesterday after dialysis For new temp 5/13  Allergies and medications reviewed.   Review of Systems: A 12 point ROS discussed and pertinent positives are indicated in the HPI above.  All other systems are negative.   Vital Signs: There were no vitals taken for this visit.  Physical Exam  Constitutional:  Trach/vent  Pulmonary/Chest: He has wheezes.  vent  Skin: Skin is warm and dry.  Psychiatric:  consented wife at bedside  Vitals reviewed.    Imaging reviewed.   Labs:  COAGS: Recent Labs    03/29/18 1629  04/06/18 0006 04/06/18 0623 04/07/18 0712 04/08/18 0655  INR  --    < > 1.70 1.76 1.95 2.20  APTT 58*  --   --   --   --   --    < > = values in this interval not displayed.    BMP: Recent Labs    04/02/18 0147 04/03/18 0703 04/04/18 0636 04/06/18 0006  NA 139 144 145 144  K 3.8 3.9 4.0 3.9  CL 105 106 106 106  CO2 GLUCOSE 141* 136* 133* 131*  BUN 65* 75* 79* 68*  CALCIUM 7.9* 8.3* 8.3* 8.3*  CREATININE 3.63* 3.61* 3.34* 3.07*  GFRNONAA 16* 16* 17* 19*  GFRAA 18* 18* 20* 22*       Electronically Signed: Wai Minotti A, PA-C 04/08/2018, 9:12 AM   I spent a total of 15 minutes in face to face in clinical consultation, greater than 50% of which was counseling/coordinating care for venous access. Patient ID: Curtis Hopkins, male   DOB: [birth year], 72 y.o.   MRN: [id number]

## 2018-04-09 ENCOUNTER — Encounter (HOSPITAL_COMMUNITY): Payer: Self-pay | Admitting: Interventional Radiology

## 2018-04-09 ENCOUNTER — Other Ambulatory Visit (HOSPITAL_COMMUNITY): Payer: Self-pay

## 2018-04-09 DIAGNOSIS — J9621 Acute and chronic respiratory failure with hypoxia: Secondary | ICD-10-CM | POA: Diagnosis not present

## 2018-04-09 DIAGNOSIS — J690 Pneumonitis due to inhalation of food and vomit: Secondary | ICD-10-CM | POA: Diagnosis not present

## 2018-04-09 DIAGNOSIS — I2699 Other pulmonary embolism without acute cor pulmonale: Secondary | ICD-10-CM | POA: Diagnosis not present

## 2018-04-09 DIAGNOSIS — N170 Acute kidney failure with tubular necrosis: Secondary | ICD-10-CM | POA: Diagnosis not present

## 2018-04-09 HISTORY — PX: IR US GUIDE VASC ACCESS RIGHT: IMG2390

## 2018-04-09 HISTORY — PX: IR FLUORO GUIDE CV LINE RIGHT: IMG2283

## 2018-04-09 LAB — RENAL FUNCTION PANEL
Albumin: 2 g/dL — ABNORMAL LOW (ref 3.5–5.0)
Anion gap: 11 (ref 5–15)
BUN: 70 mg/dL — ABNORMAL HIGH (ref 6–20)
CO2: 27 mmol/L (ref 22–32)
Calcium: 8.3 mg/dL — ABNORMAL LOW (ref 8.9–10.3)
Chloride: 108 mmol/L (ref 101–111)
Creatinine, Ser: 2.79 mg/dL — ABNORMAL HIGH (ref 0.61–1.24)
GFR calc Af Amer: 25 mL/min — ABNORMAL LOW (ref >60)
GFR calc non Af Amer: 21 mL/min — ABNORMAL LOW (ref >60)
Glucose, Bld: 117 mg/dL — ABNORMAL HIGH (ref 65–99)
Phosphorus: 4.1 mg/dL (ref 2.5–4.6)
Potassium: 3.9 mmol/L (ref 3.5–5.1)
Sodium: 146 mmol/L — ABNORMAL HIGH (ref 135–145)

## 2018-04-09 LAB — CBC
HCT: 26.9 % — ABNORMAL LOW (ref 39.0–52.0)
Hemoglobin: 8.5 g/dL — ABNORMAL LOW (ref 13.0–17.0)
MCH: 29 pg (ref 26.0–34.0)
MCHC: 31.6 g/dL (ref 30.0–36.0)
MCV: 91.8 fL (ref 78.0–100.0)
Platelets: 227 10*3/uL (ref 150–400)
RBC: 2.93 MIL/uL — ABNORMAL LOW (ref 4.22–5.81)
RDW: 16.1 % — ABNORMAL HIGH (ref 11.5–15.5)
WBC: 9.8 10*3/uL (ref 4.0–10.5)

## 2018-04-09 LAB — HEPARIN LEVEL (UNFRACTIONATED)
Heparin Unfractionated: 0.24 IU/mL — ABNORMAL LOW (ref 0.30–0.70)
Heparin Unfractionated: 0.64 IU/mL (ref 0.30–0.70)

## 2018-04-09 LAB — PROTIME-INR
INR: 2.37
Prothrombin Time: 25.7 seconds — ABNORMAL HIGH (ref 11.4–15.2)

## 2018-04-09 MED ORDER — LIDOCAINE HCL 1 % IJ SOLN
INTRAMUSCULAR | Status: AC
  Filled 2018-04-09: qty 20

## 2018-04-09 MED ORDER — LIDOCAINE HCL (PF) 1 % IJ SOLN
INTRAMUSCULAR | Status: DC | PRN
  Administered 2018-04-09: 10 mL

## 2018-04-09 MED ORDER — HEPARIN SODIUM (PORCINE) 1000 UNIT/ML IJ SOLN
INTRAMUSCULAR | Status: AC
  Administered 2018-04-09: 2.8 mL
  Filled 2018-04-09: qty 1

## 2018-04-09 NOTE — Progress Notes (Signed)
Pulmonary Critical Care Medicine [facility] [place]   PULMONARY SERVICE  PROGRESS NOTE  Date of Service: 04/09/2018  Curtis Hopkins  ZOX:[id number]  DOB: Aug 31, 1946   DOA: 2018/04/20  Referring Physician: Carron Curie, MD  HPI: Curtis Hopkins is a 72 y.o. male seen for follow up of Acute on Chronic Respiratory Failure.  Patient is comfortable without distress at this time.  Remains on T collar just came back from surgery for dialysis catheter  Medications: Reviewed on Rounds  Physical Exam:  Vitals: Temperature 99.8 pulse 77 respiratory rate 21 blood pressure 138/70 saturations 98%  Ventilator Settings on T collar 35% FiO2  . General: Comfortable at this time . Eyes: Grossly normal lids, irises & conjunctiva . ENT: grossly tongue is normal . Neck: no obvious mass . Cardiovascular: S1-S2 normal no gallop . Respiratory: Rhonchi expansion equal . Abdomen: Soft nontender obese . Skin: no rash seen on limited exam . Musculoskeletal: not rigid . Psychiatric:unable to assess . Neurologic: no seizure no involuntary movements         Labs on Admission:  Basic Metabolic Panel: Recent Labs  Lab 04/03/18 0703 04/04/18 0636 04/06/18 0006  NA 144 145 144  K 3.9 4.0 3.9  CL 106 106 106  CO2 GLUCOSE 136* 133* 131*  BUN 75* 79* 68*  CREATININE 3.61* 3.34* 3.07*  CALCIUM 8.3* 8.3* 8.3*  PHOS 5.5* 4.9* 4.3    Liver Function Tests: Recent Labs  Lab 04/03/18 0703 04/04/18 0636 04/06/18 0006  ALBUMIN 1.9* 2.0* 2.0*   No results for input(s): LIPASE, AMYLASE in the last 168 hours. No results for input(s): AMMONIA in the last 168 hours.  CBC: Recent Labs  Lab 04/03/18 0703 04/04/18 0636 04/05/18 1531 04/06/18 0006 04/09/18 0153  WBC 13.1* 11.8* 11.8* 12.0* 9.8  HGB 9.0* 9.1* 9.2* 9.4* 8.5*  HCT 28.6* 29.3* 29.0* 30.0* 26.9*  MCV 90.5 91.3 90.6 92.3 91.8  PLT 246 258 267 279 227    Cardiac Enzymes: No results for input(s): CKTOTAL,  CKMB, CKMBINDEX, TROPONINI in the last 168 hours.  BNP (last 3 results) No results for input(s): BNP in the last 8760 hours.  ProBNP (last 3 results) No results for input(s): PROBNP in the last 8760 hours.  Radiological Exams on Admission: Ir Fluoro Guide Cv Line Right  Result Date: 04/09/2018 CLINICAL DATA:  Need for non tunneled hemodialysis catheter. EXAM: NON-TUNNELED CENTRAL VENOUS HEMODIALYSIS CATHETER PLACEMENT WITH ULTRASOUND AND FLUOROSCOPIC GUIDANCE FLUOROSCOPY TIME:  18 SECONDS.  3.0 MGY. PROCEDURE: The procedure, risks, benefits, and alternatives were explained to the patient's wife. Questions regarding the procedure were encouraged and answered. The patient's wife understands and consents to the procedure. A time-out was performed prior to initiating the procedure. The right neck and chest were prepped with chlorhexidine in a sterile fashion, and a sterile drape was applied covering the operative field. Maximum barrier sterile technique with sterile gowns and gloves were used for the procedure. Local anesthesia was provided with 1% lidocaine. Ultrasound was utilized to confirm patency of the right internal jugular vein. After creating a small venotomy incision, a 19 gauge needle was advanced into the right internal jugular vein under direct, real-time ultrasound guidance. Ultrasound image documentation was performed. After securing guidewire access the venotomy was dilated. A 20 cm, 12 French triple lumen Mahurkar non tunneled dialysis catheter was advanced over the wire. Final catheter positioning was confirmed and documented with a fluoroscopic spot image. The catheter was aspirated, flushed with  saline, and injected with appropriate volume heparin dwells. The catheter exit site was secured with 0-Prolene retention sutures. COMPLICATIONS: None.  No pneumothorax. FINDINGS: After catheter placement, the tip lies at the cavoatrial junction. The catheter aspirates normally and is ready for  immediate use. IMPRESSION: Placement of non-tunneled central venous hemodialysis catheter via the right internal jugular vein. The catheter tip lies at the cavoatrial junction. The catheter is ready for immediate use. Electronically Signed   By: Irish Lack M.D.   On: 04/09/2018 16:39   Ir US Guide Vasc Access Right  Result Date: 04/09/2018 CLINICAL DATA:  Need for non tunneled hemodialysis catheter. EXAM: NON-TUNNELED CENTRAL VENOUS HEMODIALYSIS CATHETER PLACEMENT WITH ULTRASOUND AND FLUOROSCOPIC GUIDANCE FLUOROSCOPY TIME:  18 SECONDS.  3.0 MGY. PROCEDURE: The procedure, risks, benefits, and alternatives were explained to the patient's wife. Questions regarding the procedure were encouraged and answered. The patient's wife understands and consents to the procedure. A time-out was performed prior to initiating the procedure. The right neck and chest were prepped with chlorhexidine in a sterile fashion, and a sterile drape was applied covering the operative field. Maximum barrier sterile technique with sterile gowns and gloves were used for the procedure. Local anesthesia was provided with 1% lidocaine. Ultrasound was utilized to confirm patency of the right internal jugular vein. After creating a small venotomy incision, a 19 gauge needle was advanced into the right internal jugular vein under direct, real-time ultrasound guidance. Ultrasound image documentation was performed. After securing guidewire access the venotomy was dilated. A 20 cm, 12 French triple lumen Mahurkar non tunneled dialysis catheter was advanced over the wire. Final catheter positioning was confirmed and documented with a fluoroscopic spot image. The catheter was aspirated, flushed with saline, and injected with appropriate volume heparin dwells. The catheter exit site was secured with 0-Prolene retention sutures. COMPLICATIONS: None.  No pneumothorax. FINDINGS: After catheter placement, the tip lies at the cavoatrial junction. The  catheter aspirates normally and is ready for immediate use. IMPRESSION: Placement of non-tunneled central venous hemodialysis catheter via the right internal jugular vein. The catheter tip lies at the cavoatrial junction. The catheter is ready for immediate use. Electronically Signed   By: Irish Lack M.D.   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 Acute on chronic respiratory failure with hypoxia we will continue with supportive care wean advance to 24 hours as tolerated.  Chronic atrial fibrillation rate is controlled we will continue to follow 2. Aspiration pneumonia stable at this time will monitor 3. Severe sepsis hemodynamically stable 4. Acute tubular necrosis followed by nephrology status post hemodialysis catheter placement 5. Stroke continue with restorative therapy 6. Pulmonary embolism at baseline   I have personally seen and evaluated the patient, evaluated laboratory and imaging results, formulated the assessment and plan and placed orders. The Patient requires high complexity decision making for assessment and support.  Case was discussed on Rounds with the Respiratory Therapy Staff  Yevonne Pax, MD [facility] Pulmonary Critical Care Medicine Sleep Medicine

## 2018-04-09 NOTE — Progress Notes (Signed)
[name]  ROUNDING NOTE   Subjective:  Temporary dialysis catheter was removed over the weekend. MRSA was found in the blood.   Objective:  Vital signs in last 24 hours:  Temperature 99.8 pulse 77 respirations 21 blood pressure 138/70  Physical Exam: General: No acute distress  Head: Normocephalic, atraumatic. Moist oral mucosal membranes  Eyes: Anicteric  Neck: Tracheostomy present  Lungs:  Scattered rhonchi, normal effort  Heart: S1S2 no rubs  Abdomen:  Soft, nontender, bowel sounds present, PEG present  Extremities: 1+ peripheral edema.  Neurologic: Awake, alert, not following commands  Skin: No lesions  Access: Left internal jugular temporary dialysis catheter    Basic Metabolic Panel: Recent Labs  Lab 04/03/18 0703 04/04/18 0636 04/06/18 0006  NA 144 145 144  K 3.9 4.0 3.9  CL 106 106 106  CO2 GLUCOSE 136* 133* 131*  BUN 75* 79* 68*  CREATININE 3.61* 3.34* 3.07*  CALCIUM 8.3* 8.3* 8.3*  PHOS 5.5* 4.9* 4.3    Liver Function Tests: Recent Labs  Lab 04/03/18 0703 04/04/18 0636 04/06/18 0006  ALBUMIN 1.9* 2.0* 2.0*   No results for input(s): LIPASE, AMYLASE in the last 168 hours. No results for input(s): AMMONIA in the last 168 hours.  CBC: Recent Labs  Lab 04/03/18 0703 04/04/18 0636 04/05/18 1531 04/06/18 0006 04/09/18 0153  WBC 13.1* 11.8* 11.8* 12.0* 9.8  HGB 9.0* 9.1* 9.2* 9.4* 8.5*  HCT 28.6* 29.3* 29.0* 30.0* 26.9*  MCV 90.5 91.3 90.6 92.3 91.8  PLT 246 258 267 279 227    Cardiac Enzymes: No results for input(s): CKTOTAL, CKMB, CKMBINDEX, TROPONINI in the last 168 hours.  BNP: Invalid input(s): POCBNP  CBG: No results for input(s): GLUCAP in the last 168 hours.  Microbiology: Results for orders placed or performed during the hospital encounter of 04/17/2018  C difficile quick scan w PCR reflex     Status: None   Collection Time: 04/21/2018  3:01 PM  Result Value Ref Range Status   C Diff antigen NEGATIVE  NEGATIVE Final   C Diff toxin NEGATIVE NEGATIVE Final   C Diff interpretation No C. difficile detected.  Final    Comment: Performed at [facility], [address] [place], [place] [address]  Culture, Urine     Status: None   Collection Time: 03/29/18 10:45 PM  Result Value Ref Range Status   Specimen Description URINE, RANDOM  Final   Special Requests NONE  Final   Culture   Final    NO GROWTH Performed at [facility], [address] [place], [place] [address]    Report Status 03/31/2018 FINAL  Final  Culture, respiratory (NON-Expectorated)     Status: None   Collection Time: 03/30/18  8:55 AM  Result Value Ref Range Status   Specimen Description TRACHEAL ASPIRATE  Final   Special Requests NONE  Final   Gram Stain   Final    MODERATE WBC PRESENT, PREDOMINANTLY PMN MODERATE SQUAMOUS EPITHELIAL CELLS PRESENT FEW GRAM POSITIVE COCCI FEW GRAM POSITIVE RODS    Culture   Final    Consistent with normal respiratory flora. Performed at [facility], [address], [place], [place] [address]    Report Status 04/01/2018 FINAL  Final  Culture, respiratory (NON-Expectorated)     Status: None   Collection Time: 04/05/18  4:14 PM  Result Value Ref Range Status   Specimen Description TRACHEAL ASPIRATE  Final   Special Requests  NONE  Final   Gram Stain   Final    ABUNDANT WBC PRESENT, PREDOMINANTLY PMN MODERATE GRAM POSITIVE COCCI MODERATE GRAM VARIABLE ROD    Culture   Final    MODERATE Consistent with normal respiratory flora. Performed at [facility], [address] [place], [place] [address]    Report Status 04/07/2018 FINAL  Final  Culture, Urine     Status: None   Collection Time: 04/05/18  4:45 PM  Result Value Ref Range Status   Specimen Description URINE, RANDOM  Final   Special Requests NONE  Final   Culture   Final    NO GROWTH Performed at [facility], [address], [place], [place] [address]    Report Status 04/06/2018 FINAL   Final  Culture, blood (routine x 2)     Status: None (Preliminary result)   Collection Time: 04/06/18  3:19 PM  Result Value Ref Range Status   Specimen Description BLOOD RIGHT HAND  Final   Special Requests   Final    BOTTLES DRAWN AEROBIC ONLY Blood Culture adequate volume   Culture   Final    NO GROWTH 2 DAYS Performed at [facility], [address] [place], [place] [address]    Report Status PENDING  Incomplete  Culture, blood (routine x 2)     Status: Abnormal (Preliminary result)   Collection Time: 04/06/18  3:28 PM  Result Value Ref Range Status   Specimen Description BLOOD LEFT HAND  Final   Special Requests   Final    BOTTLES DRAWN AEROBIC AND ANAEROBIC Blood Culture adequate volume   Culture  Setup Time   Final    GRAM POSITIVE COCCI IN CLUSTERS IN BOTH AEROBIC AND ANAEROBIC BOTTLES GRAM POSITIVE RODS AEROBIC BOTTLE ONLY CRITICAL RESULT CALLED TO, READ BACK BY AND VERIFIED WITH: Jannett Celestine [id number] 04/07/2018 T. TYSOR    Culture (A)  Final    BACILLUS SPECIES Standardized susceptibility testing for this organism is not available. STAPHYLOCOCCUS SPECIES (COAGULASE NEGATIVE) THE SIGNIFICANCE OF ISOLATING THIS ORGANISM FROM A SINGLE SET OF BLOOD CULTURES WHEN MULTIPLE SETS ARE DRAWN IS UNCERTAIN. PLEASE NOTIFY THE MICROBIOLOGY DEPARTMENT WITHIN ONE WEEK IF SPECIATION AND SENSITIVITIES ARE REQUIRED. Performed at [facility], [address] [place], [place] [address]    Report Status PENDING  Incomplete  Blood Culture ID Panel (Reflexed)     Status: Abnormal   Collection Time: 04/06/18  3:28 PM  Result Value Ref Range Status   Enterococcus species NOT DETECTED NOT DETECTED Final   Listeria monocytogenes NOT DETECTED NOT DETECTED Final   Staphylococcus species DETECTED (A) NOT DETECTED Final    Comment: Methicillin (oxacillin) resistant coagulase negative staphylococcus. Possible blood culture contaminant (unless isolated from more than one blood culture  draw or clinical case suggests pathogenicity). No antibiotic treatment is indicated for blood  culture contaminants. CRITICAL RESULT CALLED TO, READ BACK BY AND VERIFIED WITH: N. OLUBODUN,RN [id number] 04/07/2018 T. TYSOR    Staphylococcus aureus NOT DETECTED NOT DETECTED Final   Methicillin resistance DETECTED (A) NOT DETECTED Final    Comment: CRITICAL RESULT CALLED TO, READ BACK BY AND VERIFIED WITH: N. OLUBODUN,RN 0708 04/07/2018 T. TYSOR    Streptococcus species NOT DETECTED NOT DETECTED Final   Streptococcus agalactiae NOT DETECTED NOT DETECTED Final   Streptococcus pneumoniae NOT DETECTED NOT DETECTED Final   Streptococcus pyogenes NOT DETECTED NOT DETECTED Final   Acinetobacter baumannii NOT DETECTED NOT DETECTED Final  Enterobacteriaceae species NOT DETECTED NOT DETECTED Final   Enterobacter cloacae complex NOT DETECTED NOT DETECTED Final   Escherichia coli NOT DETECTED NOT DETECTED Final   Klebsiella oxytoca NOT DETECTED NOT DETECTED Final   Klebsiella pneumoniae NOT DETECTED NOT DETECTED Final   Proteus species NOT DETECTED NOT DETECTED Final   Serratia marcescens NOT DETECTED NOT DETECTED Final   Haemophilus influenzae NOT DETECTED NOT DETECTED Final   Neisseria meningitidis NOT DETECTED NOT DETECTED Final   Pseudomonas aeruginosa NOT DETECTED NOT DETECTED Final   Candida albicans NOT DETECTED NOT DETECTED Final   Candida glabrata NOT DETECTED NOT DETECTED Final   Candida krusei NOT DETECTED NOT DETECTED Final   Candida parapsilosis NOT DETECTED NOT DETECTED Final   Candida tropicalis NOT DETECTED NOT DETECTED Final    Coagulation Studies: Recent Labs    04/07/18 0712 04/08/18 0655 04/09/18 0153  LABPROT 22.1* 24.2* 25.7*  INR 1.95 2.20 2.37    Urinalysis: No results for input(s): COLORURINE, LABSPEC, PHURINE, GLUCOSEU, HGBUR, BILIRUBINUR, KETONESUR, PROTEINUR, UROBILINOGEN, NITRITE, LEUKOCYTESUR in the last 72 hours.  Invalid input(s): APPERANCEUR     Imaging: No results found.   Medications:       Assessment/ Plan:  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  Patient initially presented outside hospital with facial droop as well as dysarthria.  During prior hospitalization he also developed acute respiratory failure and is status post tracheostomy placement as well as PEG tube placement.  In addition he had worsening renal function during the course of the hospitalization and temporary left internal jugular dialysis catheter was placed and he was initiated on dialysis immediately before transfer here.   1.  Acute renal failure secondary to ATN. 2.  Acute respiratory failure. 3.  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 4.  Anemia unspecified. 5.  MRSA sepsis secondary to dialysis catheter.  Plan: Temporary dialysis catheter was removed over the weekend.  Patient currently on vancomycin.  Temporary dialysis catheter to be replaced today.  He needs ongoing dialysis at the moment.  Orders have been prepared for this.  He appears to be breathing comfortably off of the ventilator at this time.  Overall prognosis guarded.   LOS: 0 Zalayah Pizzuto 5/13/20198:34 AM

## 2018-04-10 DIAGNOSIS — J189 Pneumonia, unspecified organism: Secondary | ICD-10-CM

## 2018-04-10 DIAGNOSIS — I2699 Other pulmonary embolism without acute cor pulmonale: Secondary | ICD-10-CM | POA: Diagnosis not present

## 2018-04-10 DIAGNOSIS — J9621 Acute and chronic respiratory failure with hypoxia: Secondary | ICD-10-CM | POA: Diagnosis not present

## 2018-04-10 DIAGNOSIS — J690 Pneumonitis due to inhalation of food and vomit: Secondary | ICD-10-CM | POA: Diagnosis not present

## 2018-04-10 DIAGNOSIS — N170 Acute kidney failure with tubular necrosis: Secondary | ICD-10-CM | POA: Diagnosis not present

## 2018-04-10 LAB — RENAL FUNCTION PANEL
ALBUMIN: 2 g/dL — AB (ref 3.5–5.0)
Anion gap: 9 (ref 5–15)
BUN: 60 mg/dL — ABNORMAL HIGH (ref 6–20)
CALCIUM: 8.2 mg/dL — AB (ref 8.9–10.3)
CO2: 28 mmol/L (ref 22–32)
Chloride: 111 mmol/L (ref 101–111)
Creatinine, Ser: 2.18 mg/dL — ABNORMAL HIGH (ref 0.61–1.24)
GFR calc non Af Amer: 29 mL/min — ABNORMAL LOW (ref >60)
GFR, EST AFRICAN AMERICAN: 34 mL/min — AB (ref >60)
Glucose, Bld: 114 mg/dL — ABNORMAL HIGH (ref 65–99)
PHOSPHORUS: 3.1 mg/dL (ref 2.5–4.6)
Potassium: 3.8 mmol/L (ref 3.5–5.1)
SODIUM: 148 mmol/L — AB (ref 135–145)

## 2018-04-10 LAB — CBC
HCT: 31.5 % — ABNORMAL LOW (ref 39.0–52.0)
Hemoglobin: 9.5 g/dL — ABNORMAL LOW (ref 13.0–17.0)
MCH: 28.4 pg (ref 26.0–34.0)
MCHC: 30.2 g/dL (ref 30.0–36.0)
MCV: 94 fL (ref 78.0–100.0)
Platelets: 192 10*3/uL (ref 150–400)
RBC: 3.35 MIL/uL — AB (ref 4.22–5.81)
RDW: 16.1 % — ABNORMAL HIGH (ref 11.5–15.5)
WBC: 6.7 10*3/uL (ref 4.0–10.5)

## 2018-04-10 LAB — URINALYSIS, ROUTINE W REFLEX MICROSCOPIC
Bilirubin Urine: NEGATIVE
Glucose, UA: NEGATIVE mg/dL
Ketones, ur: NEGATIVE mg/dL
Nitrite: NEGATIVE
Protein, ur: 100 mg/dL — AB
Specific Gravity, Urine: 1.015 (ref 1.005–1.030)
pH: 6 (ref 5.0–8.0)

## 2018-04-10 LAB — BLOOD GAS, ARTERIAL
Acid-Base Excess: 5 mmol/L — ABNORMAL HIGH (ref 0.0–2.0)
Bicarbonate: 28.8 mmol/L — ABNORMAL HIGH (ref 20.0–28.0)
FIO2: 28
O2 SAT: 92.5 %
PATIENT TEMPERATURE: 98.6
pCO2 arterial: 40.4 mmHg (ref 32.0–48.0)
pH, Arterial: 7.466 — ABNORMAL HIGH (ref 7.350–7.450)
pO2, Arterial: 64.8 mmHg — ABNORMAL LOW (ref 83.0–108.0)

## 2018-04-10 LAB — PROTIME-INR
INR: 2.37
PROTHROMBIN TIME: 25.7 s — AB (ref 11.4–15.2)

## 2018-04-10 NOTE — Progress Notes (Signed)
Pulmonary Critical Care Medicine [facility] [place]   PULMONARY SERVICE  PROGRESS NOTE  Date of Service: 04/10/2018  Curtis Hopkins  ZOX:[id number]  DOB: [birth year]   DOA: 04/07/18  Referring Physician: Carron Curie, MD  HPI: Curtis Hopkins is a 72 y.o. male seen for follow up of Acute on Chronic Respiratory Failure.  Patient is on T collar tolerating fairly well on 20% FiO2 at this time.  Medications: Reviewed on Rounds  Physical Exam:  Vitals: Temperature 100.1 pulse 82 respiratory rate 27 blood pressure 133/67 saturation 95%  Ventilator Settings T collar FiO2 20%  . General: Comfortable at this time . Eyes: Grossly normal lids, irises & conjunctiva . ENT: grossly tongue is normal . Neck: no obvious mass . Cardiovascular: S1-S2 normal no gallop or rub . Respiratory: No rhonchi expansion is equal . Abdomen: Soft nontender . Skin: no rash seen on limited exam . Musculoskeletal: not rigid . Psychiatric:unable to assess . Neurologic: no seizure no involuntary movements         Labs on Admission:  Basic Metabolic Panel: Recent Labs  Lab 04/04/18 0636 04/06/18 0006 04/09/18 1759  NA 145 144 146*  K 4.0 3.9 3.9  CL 106 106 108  CO2 GLUCOSE 133* 131* 117*  BUN 79* 68* 70*  CREATININE 3.34* 3.07* 2.79*  CALCIUM 8.3* 8.3* 8.3*  PHOS 4.9* 4.3 4.1    Liver Function Tests: Recent Labs  Lab 04/04/18 0636 04/06/18 0006 04/09/18 1759  ALBUMIN 2.0* 2.0* 2.0*   No results for input(s): LIPASE, AMYLASE in the last 168 hours. No results for input(s): AMMONIA in the last 168 hours.  CBC: Recent Labs  Lab 04/04/18 0636 04/05/18 1531 04/06/18 0006 04/09/18 0153  WBC 11.8* 11.8* 12.0* 9.8  HGB 9.1* 9.2* 9.4* 8.5*  HCT 29.3* 29.0* 30.0* 26.9*  MCV 91.3 90.6 92.3 91.8  PLT 258 267 279 227    Cardiac Enzymes: No results for input(s): CKTOTAL, CKMB, CKMBINDEX, TROPONINI in the last 168 hours.  BNP (last 3 results) No results for  input(s): BNP in the last 8760 hours.  ProBNP (last 3 results) No results for input(s): PROBNP in the last 8760 hours.  Radiological Exams on Admission: Ir Fluoro Guide Cv Line Right  Result Date: 04/09/2018 CLINICAL DATA:  Need for non tunneled hemodialysis catheter. EXAM: NON-TUNNELED CENTRAL VENOUS HEMODIALYSIS CATHETER PLACEMENT WITH ULTRASOUND AND FLUOROSCOPIC GUIDANCE FLUOROSCOPY TIME:  18 SECONDS.  3.0 MGY. PROCEDURE: The procedure, risks, benefits, and alternatives were explained to the patient's wife. Questions regarding the procedure were encouraged and answered. The patient's wife understands and consents to the procedure. A time-out was performed prior to initiating the procedure. The right neck and chest were prepped with chlorhexidine in a sterile fashion, and a sterile drape was applied covering the operative field. Maximum barrier sterile technique with sterile gowns and gloves were used for the procedure. Local anesthesia was provided with 1% lidocaine. Ultrasound was utilized to confirm patency of the right internal jugular vein. After creating a small venotomy incision, a 19 gauge needle was advanced into the right internal jugular vein under direct, real-time ultrasound guidance. Ultrasound image documentation was performed. After securing guidewire access the venotomy was dilated. A 20 cm, 12 French triple lumen Mahurkar non tunneled dialysis catheter was advanced over the wire. Final catheter positioning was confirmed and documented with a fluoroscopic spot image. The catheter was aspirated, flushed with saline, and injected with appropriate volume heparin dwells. The catheter exit site  was secured with 0-Prolene retention sutures. COMPLICATIONS: None.  No pneumothorax. FINDINGS: After catheter placement, the tip lies at the cavoatrial junction. The catheter aspirates normally and is ready for immediate use. IMPRESSION: Placement of non-tunneled central venous hemodialysis catheter via  the right internal jugular vein. The catheter tip lies at the cavoatrial junction. The catheter is ready for immediate use. Electronically Signed   By: Irish Lack M.D.   On: 04/09/2018 16:39   Ir US Guide Vasc Access Right  Result Date: 04/09/2018 CLINICAL DATA:  Need for non tunneled hemodialysis catheter. EXAM: NON-TUNNELED CENTRAL VENOUS HEMODIALYSIS CATHETER PLACEMENT WITH ULTRASOUND AND FLUOROSCOPIC GUIDANCE FLUOROSCOPY TIME:  18 SECONDS.  3.0 MGY. PROCEDURE: The procedure, risks, benefits, and alternatives were explained to the patient's wife. Questions regarding the procedure were encouraged and answered. The patient's wife understands and consents to the procedure. A time-out was performed prior to initiating the procedure. The right neck and chest were prepped with chlorhexidine in a sterile fashion, and a sterile drape was applied covering the operative field. Maximum barrier sterile technique with sterile gowns and gloves were used for the procedure. Local anesthesia was provided with 1% lidocaine. Ultrasound was utilized to confirm patency of the right internal jugular vein. After creating a small venotomy incision, a 19 gauge needle was advanced into the right internal jugular vein under direct, real-time ultrasound guidance. Ultrasound image documentation was performed. After securing guidewire access the venotomy was dilated. A 20 cm, 12 French triple lumen Mahurkar non tunneled dialysis catheter was advanced over the wire. Final catheter positioning was confirmed and documented with a fluoroscopic spot image. The catheter was aspirated, flushed with saline, and injected with appropriate volume heparin dwells. The catheter exit site was secured with 0-Prolene retention sutures. COMPLICATIONS: None.  No pneumothorax. FINDINGS: After catheter placement, the tip lies at the cavoatrial junction. The catheter aspirates normally and is ready for immediate use. IMPRESSION: Placement of non-tunneled  central venous hemodialysis catheter via the right internal jugular vein. The catheter tip lies at the cavoatrial junction. The catheter is ready for immediate use. Electronically Signed   By: Irish Lack M.D.   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 Acute on chronic respiratory failure with hypoxia continue weaning on T collar patient is tolerating well so far.  He was able to go 24 hours and did not go back on the ventilator overnight.  This is encouraging so we are going to get a blood gas today to make sure he is not retaining any CO2 2. Chronic atrial fibrillation rate is controlled at this time patient will continue with supportive care 3. Pulmonary embolism treated we will continue supportive care 4. Pneumonia due to aspiration at baseline monitor x-rays as needed 5. Stroke of basilar artery territory patient is at baseline needs ongoing restorative therapy 6. Severe sepsis clinically stable 7. ATN followed by nephrology for dialysis had a catheter placed yesterday continue with present management overall patient's prognosis is guarded   I have personally seen and evaluated the patient, evaluated laboratory and imaging results, formulated the assessment and plan and placed orders. The Patient requires high complexity decision  making for assessment and support.  Case was discussed on Rounds with the Respiratory Therapy Staff time 35-minute  Yevonne Pax, MD [facility] Pulmonary Critical Care Medicine Sleep Medicine

## 2018-04-11 DIAGNOSIS — J690 Pneumonitis due to inhalation of food and vomit: Secondary | ICD-10-CM | POA: Diagnosis not present

## 2018-04-11 DIAGNOSIS — N170 Acute kidney failure with tubular necrosis: Secondary | ICD-10-CM | POA: Diagnosis not present

## 2018-04-11 DIAGNOSIS — I482 Chronic atrial fibrillation: Secondary | ICD-10-CM | POA: Diagnosis not present

## 2018-04-11 DIAGNOSIS — R6521 Severe sepsis with septic shock: Secondary | ICD-10-CM | POA: Diagnosis not present

## 2018-04-11 DIAGNOSIS — J9621 Acute and chronic respiratory failure with hypoxia: Secondary | ICD-10-CM | POA: Diagnosis not present

## 2018-04-11 DIAGNOSIS — I2699 Other pulmonary embolism without acute cor pulmonale: Secondary | ICD-10-CM | POA: Diagnosis not present

## 2018-04-11 DIAGNOSIS — A419 Sepsis, unspecified organism: Secondary | ICD-10-CM | POA: Diagnosis not present

## 2018-04-11 DIAGNOSIS — I6322 Cerebral infarction due to unspecified occlusion or stenosis of basilar arteries: Secondary | ICD-10-CM | POA: Diagnosis not present

## 2018-04-11 LAB — CULTURE, BLOOD (ROUTINE X 2)
Culture: NO GROWTH
Special Requests: ADEQUATE

## 2018-04-11 LAB — URINE CULTURE

## 2018-04-11 LAB — PROTIME-INR
INR: 3
Prothrombin Time: 30.9 seconds — ABNORMAL HIGH (ref 11.4–15.2)

## 2018-04-11 NOTE — Progress Notes (Signed)
Pulmonary Critical Care Medicine [facility] [place]   PULMONARY SERVICE  PROGRESS NOTE  Date of Service: 04/11/2018  Curtis Hopkins  ZOX:[id number]  DOB: Feb 18, 1946   DOA: 04/19/2018  Referring Physician: Carron Curie, MD  HPI: Curtis Hopkins is a 72 y.o. male seen for follow up of Acute on Chronic Respiratory Failure.  Patient is weaning currently on T collar trials has been on 35% oxygen.  Patient did have an episode of vomiting and there is concern for aspiration which has resulted in increase FiO2 requirements  Medications: Reviewed on Rounds  Physical Exam:  Vitals: Temperature 91.8 pulse 77 respiratory rate 26 blood pressure 122/64 saturations 95%  Ventilator Settings off of the ventilator on T collar  . General: Comfortable at this time . Eyes: Grossly normal lids, irises & conjunctiva . ENT: grossly tongue is normal . Neck: no obvious mass . Cardiovascular: S1-S2 normal no gallop or rub . Respiratory: Scattered rhonchi expansion equal . Abdomen: Obese and soft . Skin: no rash seen on limited exam . Musculoskeletal: not rigid . Psychiatric:unable to assess . Neurologic: no seizure no involuntary movements         Labs on Admission:  Basic Metabolic Panel: Recent Labs  Lab 04/06/18 0006 04/09/18 1759 04/10/18 2020  NA 144 146* 148*  K 3.9 3.9 3.8  CL 106 108 111  CO2 GLUCOSE 131* 117* 114*  BUN 68* 70* 60*  CREATININE 3.07* 2.79* 2.18*  CALCIUM 8.3* 8.3* 8.2*  PHOS 4.3 4.1 3.1    Liver Function Tests: Recent Labs  Lab 04/06/18 0006 04/09/18 1759 04/10/18 2020  ALBUMIN 2.0* 2.0* 2.0*   No results for input(s): LIPASE, AMYLASE in the last 168 hours. No results for input(s): AMMONIA in the last 168 hours.  CBC: Recent Labs  Lab 04/05/18 1531 04/06/18 0006 04/09/18 0153 04/10/18 2020  WBC 11.8* 12.0* 9.8 6.7  HGB 9.2* 9.4* 8.5* 9.5*  HCT 29.0* 30.0* 26.9* 31.5*  MCV 90.6 92.3 91.8 94.0  PLT 267 279 227 192     Cardiac Enzymes: No results for input(s): CKTOTAL, CKMB, CKMBINDEX, TROPONINI in the last 168 hours.  BNP (last 3 results) No results for input(s): BNP in the last 8760 hours.  ProBNP (last 3 results) No results for input(s): PROBNP in the last 8760 hours.  Radiological Exams on Admission: Ir Fluoro Guide Cv Line Right  Result Date: 04/09/2018 CLINICAL DATA:  Need for non tunneled hemodialysis catheter. EXAM: NON-TUNNELED CENTRAL VENOUS HEMODIALYSIS CATHETER PLACEMENT WITH ULTRASOUND AND FLUOROSCOPIC GUIDANCE FLUOROSCOPY TIME:  18 SECONDS.  3.0 MGY. PROCEDURE: The procedure, risks, benefits, and alternatives were explained to the patient's wife. Questions regarding the procedure were encouraged and answered. The patient's wife understands and consents to the procedure. A time-out was performed prior to initiating the procedure. The right neck and chest were prepped with chlorhexidine in a sterile fashion, and a sterile drape was applied covering the operative field. Maximum barrier sterile technique with sterile gowns and gloves were used for the procedure. Local anesthesia was provided with 1% lidocaine. Ultrasound was utilized to confirm patency of the right internal jugular vein. After creating a small venotomy incision, a 19 gauge needle was advanced into the right internal jugular vein under direct, real-time ultrasound guidance. Ultrasound image documentation was performed. After securing guidewire access the venotomy was dilated. A 20 cm, 12 French triple lumen Mahurkar non tunneled dialysis catheter was advanced over the wire. Final catheter positioning was confirmed and documented  with a fluoroscopic spot image. The catheter was aspirated, flushed with saline, and injected with appropriate volume heparin dwells. The catheter exit site was secured with 0-Prolene retention sutures. COMPLICATIONS: None.  No pneumothorax. FINDINGS: After catheter placement, the tip lies at the cavoatrial  junction. The catheter aspirates normally and is ready for immediate use. IMPRESSION: Placement of non-tunneled central venous hemodialysis catheter via the right internal jugular vein. The catheter tip lies at the cavoatrial junction. The catheter is ready for immediate use. Electronically Signed   By: Irish Lack M.D.   On: 04/09/2018 16:39   Ir US Guide Vasc Access Right  Result Date: 04/09/2018 CLINICAL DATA:  Need for non tunneled hemodialysis catheter. EXAM: NON-TUNNELED CENTRAL VENOUS HEMODIALYSIS CATHETER PLACEMENT WITH ULTRASOUND AND FLUOROSCOPIC GUIDANCE FLUOROSCOPY TIME:  18 SECONDS.  3.0 MGY. PROCEDURE: The procedure, risks, benefits, and alternatives were explained to the patient's wife. Questions regarding the procedure were encouraged and answered. The patient's wife understands and consents to the procedure. A time-out was performed prior to initiating the procedure. The right neck and chest were prepped with chlorhexidine in a sterile fashion, and a sterile drape was applied covering the operative field. Maximum barrier sterile technique with sterile gowns and gloves were used for the procedure. Local anesthesia was provided with 1% lidocaine. Ultrasound was utilized to confirm patency of the right internal jugular vein. After creating a small venotomy incision, a 19 gauge needle was advanced into the right internal jugular vein under direct, real-time ultrasound guidance. Ultrasound image documentation was performed. After securing guidewire access the venotomy was dilated. A 20 cm, 12 French triple lumen Mahurkar non tunneled dialysis catheter was advanced over the wire. Final catheter positioning was confirmed and documented with a fluoroscopic spot image. The catheter was aspirated, flushed with saline, and injected with appropriate volume heparin dwells. The catheter exit site was secured with 0-Prolene retention sutures. COMPLICATIONS: None.  No pneumothorax. FINDINGS: After catheter  placement, the tip lies at the cavoatrial junction. The catheter aspirates normally and is ready for immediate use. IMPRESSION: Placement of non-tunneled central venous hemodialysis catheter via the right internal jugular vein. The catheter tip lies at the cavoatrial junction. The catheter is ready for immediate use. Electronically Signed   By: Irish Lack M.D.   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 Acute on chronic respiratory failure with hypoxia we will continue with T collar monitor oxygen requirements continue secretion management pulmonary toilet 2. Chronic atrial fibrillation rate is controlled we will continue to monitor 3. Status post stroke we will continue with restorative therapy 4. Pneumonia due to aspiration follow-up x-ray as needed 5. Acute tubular necrosis patient is being seen by nephrology for dialysis 6. Severe sepsis and shock hemodynamically stable 7. Pulmonary embolism treated   I have personally seen and evaluated the patient, evaluated laboratory and imaging results, formulated the assessment and plan and placed orders. The Patient requires high complexity decision making for assessment and support.  Case was discussed on Rounds with the Respiratory Therapy Staff  Yevonne Pax, MD [facility] Pulmonary Critical Care Medicine Sleep Medicine

## 2018-04-11 NOTE — Progress Notes (Signed)
[name]  ROUNDING NOTE   Subjective:  Temporary dialysis catheter was placed on 04/09/2018. Patient due for dialysis today.   Objective:  Vital signs in last 24 hours:  Temperature 99.8 pulse 77 respirations 26 blood pressure 122/64  Physical Exam: General: No acute distress  Head: Normocephalic, atraumatic. Moist oral mucosal membranes  Eyes: Anicteric  Neck: Tracheostomy present  Lungs:  Scattered rhonchi, normal effort  Heart: S1S2 no rubs  Abdomen:  Soft, nontender, bowel sounds present, PEG present  Extremities: 1+ peripheral edema.  Neurologic:  Arousable, not following commands  Skin: No lesions  Access:  Right internal jugular temporary dialysis catheter placed 04/09/2018    Basic Metabolic Panel: Recent Labs  Lab 04/06/18 0006 04/09/18 1759 04/10/18 2020  NA 144 146* 148*  K 3.9 3.9 3.8  CL 106 108 111  CO2 GLUCOSE 131* 117* 114*  BUN 68* 70* 60*  CREATININE 3.07* 2.79* 2.18*  CALCIUM 8.3* 8.3* 8.2*  PHOS 4.3 4.1 3.1    Liver Function Tests: Recent Labs  Lab 04/06/18 0006 04/09/18 1759 04/10/18 2020  ALBUMIN 2.0* 2.0* 2.0*   No results for input(s): LIPASE, AMYLASE in the last 168 hours. No results for input(s): AMMONIA in the last 168 hours.  CBC: Recent Labs  Lab 04/05/18 1531 04/06/18 0006 04/09/18 0153 04/10/18 2020  WBC 11.8* 12.0* 9.8 6.7  HGB 9.2* 9.4* 8.5* 9.5*  HCT 29.0* 30.0* 26.9* 31.5*  MCV 90.6 92.3 91.8 94.0  PLT 267 279 227 192    Cardiac Enzymes: No results for input(s): CKTOTAL, CKMB, CKMBINDEX, TROPONINI in the last 168 hours.  BNP: Invalid input(s): POCBNP  CBG: No results for input(s): GLUCAP in the last 168 hours.  Microbiology: Results for orders placed or performed during the hospital encounter of 04/25/2018  C difficile quick scan w PCR reflex     Status: None   Collection Time: 04/27/2018  3:01 PM  Result Value Ref Range Status   C Diff antigen NEGATIVE NEGATIVE Final   C Diff  toxin NEGATIVE NEGATIVE Final   C Diff interpretation No C. difficile detected.  Final    Comment: Performed at [facility], [address], [place], [place] [address]  Culture, Urine     Status: None   Collection Time: 03/29/18 10:45 PM  Result Value Ref Range Status   Specimen Description URINE, RANDOM  Final   Special Requests NONE  Final   Culture   Final    NO GROWTH Performed at [facility], [address], [place], [place] [address]    Report Status 03/31/2018 FINAL  Final  Culture, respiratory (NON-Expectorated)     Status: None   Collection Time: 03/30/18  8:55 AM  Result Value Ref Range Status   Specimen Description TRACHEAL ASPIRATE  Final   Special Requests NONE  Final   Gram Stain   Final    MODERATE WBC PRESENT, PREDOMINANTLY PMN MODERATE SQUAMOUS EPITHELIAL CELLS PRESENT FEW GRAM POSITIVE COCCI FEW GRAM POSITIVE RODS    Culture   Final    Consistent with normal respiratory flora. Performed at [facility], [address] [place], [place] [address]    Report Status 04/01/2018 FINAL  Final  Culture, respiratory (NON-Expectorated)     Status: None   Collection Time: 04/05/18  4:14 PM  Result Value Ref Range Status   Specimen Description TRACHEAL ASPIRATE  Final   Special Requests NONE  Final   Gram  Stain   Final    ABUNDANT WBC PRESENT, PREDOMINANTLY PMN MODERATE GRAM POSITIVE COCCI MODERATE GRAM VARIABLE ROD    Culture   Final    MODERATE Consistent with normal respiratory flora. Performed at [facility], [address], [place], [place] [address]    Report Status 04/07/2018 FINAL  Final  Culture, Urine     Status: None   Collection Time: 04/05/18  4:45 PM  Result Value Ref Range Status   Specimen Description URINE, RANDOM  Final   Special Requests NONE  Final   Culture   Final    NO GROWTH Performed at [facility], [address], [place], [place] [address]    Report Status 04/06/2018 FINAL  Final  Culture, blood  (routine x 2)     Status: None (Preliminary result)   Collection Time: 04/06/18  3:19 PM  Result Value Ref Range Status   Specimen Description BLOOD RIGHT HAND  Final   Special Requests   Final    BOTTLES DRAWN AEROBIC ONLY Blood Culture adequate volume   Culture   Final    NO GROWTH 4 DAYS Performed at [facility], [address], [place], [place] [address]    Report Status PENDING  Incomplete  Culture, blood (routine x 2)     Status: Abnormal   Collection Time: 04/06/18  3:28 PM  Result Value Ref Range Status   Specimen Description BLOOD LEFT HAND  Final   Special Requests   Final    BOTTLES DRAWN AEROBIC AND ANAEROBIC Blood Culture adequate volume   Culture  Setup Time   Final    GRAM POSITIVE COCCI IN CLUSTERS IN BOTH AEROBIC AND ANAEROBIC BOTTLES GRAM POSITIVE RODS AEROBIC BOTTLE ONLY CRITICAL RESULT CALLED TO, READ BACK BY AND VERIFIED WITH: Jannett Celestine [id number] 04/07/2018 T. TYSOR    Culture (A)  Final    BACILLUS SPECIES Standardized susceptibility testing for this organism is not available. STAPHYLOCOCCUS SPECIES (COAGULASE NEGATIVE) THE SIGNIFICANCE OF ISOLATING THIS ORGANISM FROM A SINGLE SET OF BLOOD CULTURES WHEN MULTIPLE SETS ARE DRAWN IS UNCERTAIN. PLEASE NOTIFY THE MICROBIOLOGY DEPARTMENT WITHIN ONE WEEK IF SPECIATION AND SENSITIVITIES ARE REQUIRED. Performed at [facility], [address], [place], [place] [address]    Report Status 04/09/2018 FINAL  Final  Blood Culture ID Panel (Reflexed)     Status: Abnormal   Collection Time: 04/06/18  3:28 PM  Result Value Ref Range Status   Enterococcus species NOT DETECTED NOT DETECTED Final   Listeria monocytogenes NOT DETECTED NOT DETECTED Final   Staphylococcus species DETECTED (A) NOT DETECTED Final    Comment: Methicillin (oxacillin) resistant coagulase negative staphylococcus. Possible blood culture contaminant (unless isolated from more than one blood culture draw or clinical case suggests  pathogenicity). No antibiotic treatment is indicated for blood  culture contaminants. CRITICAL RESULT CALLED TO, READ BACK BY AND VERIFIED WITH: N. OLUBODUN,RN [id number] 04/07/2018 T. TYSOR    Staphylococcus aureus NOT DETECTED NOT DETECTED Final   Methicillin resistance DETECTED (A) NOT DETECTED Final    Comment: CRITICAL RESULT CALLED TO, READ BACK BY AND VERIFIED WITH: N. OLUBODUN,RN [id number] 04/07/2018 T. TYSOR    Streptococcus species NOT DETECTED NOT DETECTED Final   Streptococcus agalactiae NOT DETECTED NOT DETECTED Final   Streptococcus pneumoniae NOT DETECTED NOT DETECTED Final   Streptococcus pyogenes NOT DETECTED NOT DETECTED Final   Acinetobacter baumannii NOT DETECTED NOT DETECTED Final   Enterobacteriaceae species NOT DETECTED NOT DETECTED  Final   Enterobacter cloacae complex NOT DETECTED NOT DETECTED Final   Escherichia coli NOT DETECTED NOT DETECTED Final   Klebsiella oxytoca NOT DETECTED NOT DETECTED Final   Klebsiella pneumoniae NOT DETECTED NOT DETECTED Final   Proteus species NOT DETECTED NOT DETECTED Final   Serratia marcescens NOT DETECTED NOT DETECTED Final   Haemophilus influenzae NOT DETECTED NOT DETECTED Final   Neisseria meningitidis NOT DETECTED NOT DETECTED Final   Pseudomonas aeruginosa NOT DETECTED NOT DETECTED Final   Candida albicans NOT DETECTED NOT DETECTED Final   Candida glabrata NOT DETECTED NOT DETECTED Final   Candida krusei NOT DETECTED NOT DETECTED Final   Candida parapsilosis NOT DETECTED NOT DETECTED Final   Candida tropicalis NOT DETECTED NOT DETECTED Final  Culture, respiratory (NON-Expectorated)     Status: None (Preliminary result)   Collection Time: 04/10/18  3:15 PM  Result Value Ref Range Status   Specimen Description TRACHEAL ASPIRATE  Final   Special Requests NONE  Final   Gram Stain   Final    ABUNDANT WBC PRESENT,BOTH PMN AND MONONUCLEAR FEW GRAM POSITIVE COCCI MODERATE GRAM VARIABLE ROD    Culture   Final    CULTURE REINCUBATED  FOR BETTER GROWTH Performed at [facility], [address] [place], [place] [address]    Report Status PENDING  Incomplete    Coagulation Studies: Recent Labs    04/09/18 0153 04/10/18 0600 04/11/18 0643  LABPROT 25.7* 25.7* 30.9*  INR 2.37 2.37 3.00    Urinalysis: Recent Labs    04/10/18 1615  COLORURINE YELLOW  LABSPEC 1.015  PHURINE 6.0  GLUCOSEU NEGATIVE  HGBUR LARGE*  BILIRUBINUR NEGATIVE  KETONESUR NEGATIVE  PROTEINUR 100*  NITRITE NEGATIVE  LEUKOCYTESUR LARGE*      Imaging: Ir Fluoro Guide Cv Line Right  Result Date: 04/09/2018 CLINICAL DATA:  Need for non tunneled hemodialysis catheter. EXAM: NON-TUNNELED CENTRAL VENOUS HEMODIALYSIS CATHETER PLACEMENT WITH ULTRASOUND AND FLUOROSCOPIC GUIDANCE FLUOROSCOPY TIME:  18 SECONDS.  3.0 MGY. PROCEDURE: The procedure, risks, benefits, and alternatives were explained to the patient's wife. Questions regarding the procedure were encouraged and answered. The patient's wife understands and consents to the procedure. A time-out was performed prior to initiating the procedure. The right neck and chest were prepped with chlorhexidine in a sterile fashion, and a sterile drape was applied covering the operative field. Maximum barrier sterile technique with sterile gowns and gloves were used for the procedure. Local anesthesia was provided with 1% lidocaine. Ultrasound was utilized to confirm patency of the right internal jugular vein. After creating a small venotomy incision, a 19 gauge needle was advanced into the right internal jugular vein under direct, real-time ultrasound guidance. Ultrasound image documentation was performed. After securing guidewire access the venotomy was dilated. A 20 cm, 12 French triple lumen Mahurkar non tunneled dialysis catheter was advanced over the wire. Final catheter positioning was confirmed and documented with a fluoroscopic spot image. The catheter was aspirated, flushed with saline, and injected  with appropriate volume heparin dwells. The catheter exit site was secured with 0-Prolene retention sutures. COMPLICATIONS: None.  No pneumothorax. FINDINGS: After catheter placement, the tip lies at the cavoatrial junction. The catheter aspirates normally and is ready for immediate use. IMPRESSION: Placement of non-tunneled central venous hemodialysis catheter via the right internal jugular vein. The catheter tip lies at the cavoatrial junction. The catheter is ready for immediate use. Electronically Signed   By: Irish Lack M.D.   On: 04/09/2018 16:39   Ir US  Guide Vasc Access Right  Result Date: 04/09/2018 CLINICAL DATA:  Need for non tunneled hemodialysis catheter. EXAM: NON-TUNNELED CENTRAL VENOUS HEMODIALYSIS CATHETER PLACEMENT WITH ULTRASOUND AND FLUOROSCOPIC GUIDANCE FLUOROSCOPY TIME:  18 SECONDS.  3.0 MGY. PROCEDURE: The procedure, risks, benefits, and alternatives were explained to the patient's wife. Questions regarding the procedure were encouraged and answered. The patient's wife understands and consents to the procedure. A time-out was performed prior to initiating the procedure. The right neck and chest were prepped with chlorhexidine in a sterile fashion, and a sterile drape was applied covering the operative field. Maximum barrier sterile technique with sterile gowns and gloves were used for the procedure. Local anesthesia was provided with 1% lidocaine. Ultrasound was utilized to confirm patency of the right internal jugular vein. After creating a small venotomy incision, a 19 gauge needle was advanced into the right internal jugular vein under direct, real-time ultrasound guidance. Ultrasound image documentation was performed. After securing guidewire access the venotomy was dilated. A 20 cm, 12 French triple lumen Mahurkar non tunneled dialysis catheter was advanced over the wire. Final catheter positioning was confirmed and documented with a fluoroscopic spot image. The catheter was  aspirated, flushed with saline, and injected with appropriate volume heparin dwells. The catheter exit site was secured with 0-Prolene retention sutures. COMPLICATIONS: None.  No pneumothorax. FINDINGS: After catheter placement, the tip lies at the cavoatrial junction. The catheter aspirates normally and is ready for immediate use. IMPRESSION: Placement of non-tunneled central venous hemodialysis catheter via the right internal jugular vein. The catheter tip lies at the cavoatrial junction. The catheter is ready for immediate use. Electronically Signed   By: Irish Lack M.D.   On: 04/09/2018 16:39     Medications:       Assessment/ Plan:  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  Patient initially presented outside hospital with facial droop as well as dysarthria.  During prior hospitalization he also developed acute respiratory failure and is status post tracheostomy placement as well as PEG tube placement.  In addition he had worsening renal function during the course of the hospitalization and temporary left internal jugular dialysis catheter was placed and he was initiated on dialysis immediately before transfer here.   1.  Acute renal failure secondary to ATN. 2.  Acute respiratory failure. 3.  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 4.  Anemia unspecified. 5.  MRSA sepsis secondary to dialysis catheter.  Plan: BUN and creatinine do appear to be trending down.  We will plan for dialysis today and Friday and then determine over the week and asked whether he needs any further dialysis.  He appears to be breathing comfortably on aerosolized trach collar.  However he is not made much progress in terms of his CVA as he still remains largely nonverbal.  He does  periodically open his eyes and tracks.  Otherwise management as per hospitalist.   LOS: 0 Curtis Hopkins 5/15/201912:17 PM

## 2018-04-12 DIAGNOSIS — I2699 Other pulmonary embolism without acute cor pulmonale: Secondary | ICD-10-CM | POA: Diagnosis not present

## 2018-04-12 DIAGNOSIS — J9621 Acute and chronic respiratory failure with hypoxia: Secondary | ICD-10-CM | POA: Diagnosis not present

## 2018-04-12 DIAGNOSIS — J690 Pneumonitis due to inhalation of food and vomit: Secondary | ICD-10-CM | POA: Diagnosis not present

## 2018-04-12 DIAGNOSIS — N170 Acute kidney failure with tubular necrosis: Secondary | ICD-10-CM | POA: Diagnosis not present

## 2018-04-12 LAB — RENAL FUNCTION PANEL
ALBUMIN: 1.9 g/dL — AB (ref 3.5–5.0)
ANION GAP: 10 (ref 5–15)
BUN: 57 mg/dL — ABNORMAL HIGH (ref 6–20)
CALCIUM: 8.1 mg/dL — AB (ref 8.9–10.3)
CO2: 28 mmol/L (ref 22–32)
CREATININE: 2.76 mg/dL — AB (ref 0.61–1.24)
Chloride: 105 mmol/L (ref 101–111)
GFR calc Af Amer: 25 mL/min — ABNORMAL LOW (ref >60)
GFR, EST NON AFRICAN AMERICAN: 22 mL/min — AB (ref >60)
Glucose, Bld: 140 mg/dL — ABNORMAL HIGH (ref 65–99)
PHOSPHORUS: 3.6 mg/dL (ref 2.5–4.6)
Potassium: 3.3 mmol/L — ABNORMAL LOW (ref 3.5–5.1)
SODIUM: 143 mmol/L (ref 135–145)

## 2018-04-12 LAB — CULTURE, RESPIRATORY W GRAM STAIN: Culture: NORMAL

## 2018-04-12 LAB — PROTIME-INR
INR: 2.24
Prothrombin Time: 24.6 seconds — ABNORMAL HIGH (ref 11.4–15.2)

## 2018-04-12 LAB — CBC
HEMATOCRIT: 28.6 % — AB (ref 39.0–52.0)
HEMOGLOBIN: 8.8 g/dL — AB (ref 13.0–17.0)
MCH: 28.5 pg (ref 26.0–34.0)
MCHC: 30.8 g/dL (ref 30.0–36.0)
MCV: 92.6 fL (ref 78.0–100.0)
Platelets: 140 10*3/uL — ABNORMAL LOW (ref 150–400)
RBC: 3.09 MIL/uL — ABNORMAL LOW (ref 4.22–5.81)
RDW: 15.9 % — ABNORMAL HIGH (ref 11.5–15.5)
WBC: 10.5 10*3/uL (ref 4.0–10.5)

## 2018-04-12 LAB — CULTURE, RESPIRATORY

## 2018-04-12 LAB — MAGNESIUM: Magnesium: 2.1 mg/dL (ref 1.7–2.4)

## 2018-04-12 NOTE — Progress Notes (Signed)
Pulmonary Critical Care Medicine [facility]   PULMONARY SERVICE  PROGRESS NOTE  Date of Service: 04/12/2018  Curtis Hopkins  ZOX:[id number]  DOB: 1946/07/02   DOA: 04/13/2018  Referring Physician: Carron Curie, MD  HPI: Curtis Hopkins is a 72 y.o. male seen for follow up of Acute on Chronic Respiratory Failure.  Patient has right now on T collar has been on 28% FiO2 for more than 72 hours  Medications: Reviewed on Rounds  Physical Exam:  Vitals: Temperature 97.4 pulse 78 respiratory 24 blood pressure 125/60 saturation 95%  Ventilator Settings T collar trials currently on 28% FiO2  . General: Comfortable at this time . Eyes: Grossly normal lids, irises & conjunctiva . ENT: grossly tongue is normal . Neck: no obvious mass . Cardiovascular: S1-S2 normal no gallop or rub . Respiratory: No rhonchi expansion is equal . Abdomen: Soft and nontender . Skin: no rash seen on limited exam . Musculoskeletal: not rigid . Psychiatric:unable to assess . Neurologic: no seizure no involuntary movements         Labs on Admission:  Basic Metabolic Panel: Recent Labs  Lab 04/06/18 0006 04/09/18 1759 04/10/18 2020 04/12/18 0711  NA 144 146* 148* 143  K 3.9 3.9 3.8 3.3*  CL 106 108 111 105  CO2 GLUCOSE 131* 117* 114* 140*  BUN 68* 70* 60* 57*  CREATININE 3.07* 2.79* 2.18* 2.76*  CALCIUM 8.3* 8.3* 8.2* 8.1*  MG  --   --   --  2.1  PHOS 4.3 4.1 3.1 3.6    Liver Function Tests: Recent Labs  Lab 04/06/18 0006 04/09/18 1759 04/10/18 2020 04/12/18 0711  ALBUMIN 2.0* 2.0* 2.0* 1.9*   No results for input(s): LIPASE, AMYLASE in the last 168 hours. No results for input(s): AMMONIA in the last 168 hours.  CBC: Recent Labs  Lab 04/06/18 0006 04/09/18 0153 04/10/18 2020 04/12/18 0711  WBC 12.0* 9.8 6.7 10.5  HGB 9.4* 8.5* 9.5* 8.8*  HCT 30.0* 26.9* 31.5* 28.6*  MCV 92.3 91.8 94.0 92.6  PLT 279 227 192 140*    Cardiac Enzymes: No  results for input(s): CKTOTAL, CKMB, CKMBINDEX, TROPONINI in the last 168 hours.  BNP (last 3 results) No results for input(s): BNP in the last 8760 hours.  ProBNP (last 3 results) No results for input(s): PROBNP in the last 8760 hours.  Radiological Exams on Admission: Ir Fluoro Guide Cv Line Right  Result Date: 04/09/2018 CLINICAL DATA:  Need for non tunneled hemodialysis catheter. EXAM: NON-TUNNELED CENTRAL VENOUS HEMODIALYSIS CATHETER PLACEMENT WITH ULTRASOUND AND FLUOROSCOPIC GUIDANCE FLUOROSCOPY TIME:  18 SECONDS.  3.0 MGY. PROCEDURE: The procedure, risks, benefits, and alternatives were explained to the patient's wife. Questions regarding the procedure were encouraged and answered. The patient's wife understands and consents to the procedure. A time-out was performed prior to initiating the procedure. The right neck and chest were prepped with chlorhexidine in a sterile fashion, and a sterile drape was applied covering the operative field. Maximum barrier sterile technique with sterile gowns and gloves were used for the procedure. Local anesthesia was provided with 1% lidocaine. Ultrasound was utilized to confirm patency of the right internal jugular vein. After creating a small venotomy incision, a 19 gauge needle was advanced into the right internal jugular vein under direct, real-time ultrasound guidance. Ultrasound image documentation was performed. After securing guidewire access the venotomy was dilated. A 20 cm, 12 French triple lumen Mahurkar non tunneled dialysis catheter was advanced over  the wire. Final catheter positioning was confirmed and documented with a fluoroscopic spot image. The catheter was aspirated, flushed with saline, and injected with appropriate volume heparin dwells. The catheter exit site was secured with 0-Prolene retention sutures. COMPLICATIONS: None.  No pneumothorax. FINDINGS: After catheter placement, the tip lies at the cavoatrial junction. The catheter aspirates  normally and is ready for immediate use. IMPRESSION: Placement of non-tunneled central venous hemodialysis catheter via the right internal jugular vein. The catheter tip lies at the cavoatrial junction. The catheter is ready for immediate use. Electronically Signed   By: Irish Lack M.D.   On: 04/09/2018 16:39   Ir US Guide Vasc Access Right  Result Date: 04/09/2018 CLINICAL DATA:  Need for non tunneled hemodialysis catheter. EXAM: NON-TUNNELED CENTRAL VENOUS HEMODIALYSIS CATHETER PLACEMENT WITH ULTRASOUND AND FLUOROSCOPIC GUIDANCE FLUOROSCOPY TIME:  18 SECONDS.  3.0 MGY. PROCEDURE: The procedure, risks, benefits, and alternatives were explained to the patient's wife. Questions regarding the procedure were encouraged and answered. The patient's wife understands and consents to the procedure. A time-out was performed prior to initiating the procedure. The right neck and chest were prepped with chlorhexidine in a sterile fashion, and a sterile drape was applied covering the operative field. Maximum barrier sterile technique with sterile gowns and gloves were used for the procedure. Local anesthesia was provided with 1% lidocaine. Ultrasound was utilized to confirm patency of the right internal jugular vein. After creating a small venotomy incision, a 19 gauge needle was advanced into the right internal jugular vein under direct, real-time ultrasound guidance. Ultrasound image documentation was performed. After securing guidewire access the venotomy was dilated. A 20 cm, 12 French triple lumen Mahurkar non tunneled dialysis catheter was advanced over the wire. Final catheter positioning was confirmed and documented with a fluoroscopic spot image. The catheter was aspirated, flushed with saline, and injected with appropriate volume heparin dwells. The catheter exit site was secured with 0-Prolene retention sutures. COMPLICATIONS: None.  No pneumothorax. FINDINGS: After catheter placement, the tip lies at the  cavoatrial junction. The catheter aspirates normally and is ready for immediate use. IMPRESSION: Placement of non-tunneled central venous hemodialysis catheter via the right internal jugular vein. The catheter tip lies at the cavoatrial junction. The catheter is ready for immediate use. Electronically Signed   By: Irish Lack M.D.   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 Acute on chronic respiratory failure with hypoxia weaning on T collar hopefully we should be able to advance to capping trials soon we will continue with supportive care and follow along 2. Chronic atrial fibrillation rate is controlled we will continue to support 3. Acute pulmonary embolism we will continue present management 4. Pneumonia due to aspiration will continue supportive care clinically improved 5. Severe sepsis hemodynamically stable 6. Acute tubular necrosis followed by nephrology for dialysis   I have personally seen and evaluated the patient, evaluated laboratory and imaging results, formulated the assessment and plan and placed orders. The Patient requires high complexity decision making for assessment and support.  Case was discussed on Rounds with the Respiratory Therapy Staff  Yevonne Pax, MD [facility] Pulmonary Critical Care  Medicine Sleep Medicine

## 2018-04-13 ENCOUNTER — Other Ambulatory Visit (HOSPITAL_COMMUNITY): Payer: Self-pay

## 2018-04-13 ENCOUNTER — Encounter (HOSPITAL_COMMUNITY): Payer: Self-pay | Admitting: Nurse Practitioner

## 2018-04-13 DIAGNOSIS — N170 Acute kidney failure with tubular necrosis: Secondary | ICD-10-CM | POA: Diagnosis not present

## 2018-04-13 DIAGNOSIS — J690 Pneumonitis due to inhalation of food and vomit: Secondary | ICD-10-CM | POA: Diagnosis not present

## 2018-04-13 DIAGNOSIS — I2699 Other pulmonary embolism without acute cor pulmonale: Secondary | ICD-10-CM | POA: Diagnosis not present

## 2018-04-13 DIAGNOSIS — J9621 Acute and chronic respiratory failure with hypoxia: Secondary | ICD-10-CM | POA: Diagnosis not present

## 2018-04-13 HISTORY — PX: IR PATIENT EVAL TECH 0-60 MINS: IMG5564

## 2018-04-13 LAB — RENAL FUNCTION PANEL
ALBUMIN: 1.9 g/dL — AB (ref 3.5–5.0)
ANION GAP: 9 (ref 5–15)
BUN: 67 mg/dL — AB (ref 6–20)
CALCIUM: 8.2 mg/dL — AB (ref 8.9–10.3)
CO2: 27 mmol/L (ref 22–32)
CREATININE: 2.98 mg/dL — AB (ref 0.61–1.24)
Chloride: 109 mmol/L (ref 101–111)
GFR calc Af Amer: 23 mL/min — ABNORMAL LOW (ref >60)
GFR calc non Af Amer: 20 mL/min — ABNORMAL LOW (ref >60)
GLUCOSE: 145 mg/dL — AB (ref 65–99)
PHOSPHORUS: 4.1 mg/dL (ref 2.5–4.6)
Potassium: 3.2 mmol/L — ABNORMAL LOW (ref 3.5–5.1)
SODIUM: 145 mmol/L (ref 135–145)

## 2018-04-13 LAB — URINALYSIS, ROUTINE W REFLEX MICROSCOPIC
Bilirubin Urine: NEGATIVE
Glucose, UA: NEGATIVE mg/dL
KETONES UR: NEGATIVE mg/dL
Nitrite: NEGATIVE
PROTEIN: 30 mg/dL — AB
Specific Gravity, Urine: 1.016 (ref 1.005–1.030)
pH: 5 (ref 5.0–8.0)

## 2018-04-13 LAB — CBC
HCT: 29.1 % — ABNORMAL LOW (ref 39.0–52.0)
HEMOGLOBIN: 9 g/dL — AB (ref 13.0–17.0)
MCH: 28.6 pg (ref 26.0–34.0)
MCHC: 30.9 g/dL (ref 30.0–36.0)
MCV: 92.4 fL (ref 78.0–100.0)
PLATELETS: 170 10*3/uL (ref 150–400)
RBC: 3.15 MIL/uL — ABNORMAL LOW (ref 4.22–5.81)
RDW: 15.9 % — AB (ref 11.5–15.5)
WBC: 10.2 10*3/uL (ref 4.0–10.5)

## 2018-04-13 LAB — PROTIME-INR
INR: 1.55
Prothrombin Time: 18.4 seconds — ABNORMAL HIGH (ref 11.4–15.2)

## 2018-04-13 NOTE — Procedures (Signed)
04/13/18 1400.  Went to bedside to remove non-tunneled dialysis catheter approved by Dr. Oley Balm. Dressing removed after discussion with RN and confirmation of INR of 1.55. Stitch removed and catheter d/c'd.  Pressure held for 7 minutes and dressing applied.

## 2018-04-13 NOTE — Progress Notes (Signed)
Pulmonary Critical Care Medicine [facility] [place]   PULMONARY SERVICE  PROGRESS NOTE  Date of Service: 04/13/2018  Curtis Hopkins  ZOX:[id number]  DOB: [birth year]   DOA: 2018/04/05  Referring Physician: Carron Curie, MD  HPI: Curtis Hopkins is a 72 y.o. male seen for follow up of Acute on Chronic Respiratory Failure.  Currently on T collar has been doing fairly well has been on T collar for more than 72 hours.  Comfortable without distress.  Low-grade temperatures noted otherwise  Medications: Reviewed on Rounds  Physical Exam:  Vitals: Temperature 100.8 pulse 78 respiratory rate 25 blood pressure 115/71 saturations 97%  Ventilator Settings aerosolized T collar FiO2 20%  . General: Comfortable at this time . Eyes: Grossly normal lids, irises & conjunctiva . ENT: grossly tongue is normal . Neck: no obvious mass . Cardiovascular: S1-S2 normal no gallop or rub . Respiratory: No rhonchi expansion equal . Abdomen: Soft nontender . Skin: no rash seen on limited exam . Musculoskeletal: not rigid . Psychiatric:unable to assess . Neurologic: no seizure no involuntary movements         Labs on Admission:  Basic Metabolic Panel: Recent Labs  Lab 04/09/18 1759 04/10/18 2020 04/12/18 0711 04/13/18 0706  NA 146* 148* 143 145  K 3.9 3.8 3.3* 3.2*  CL 108 111 105 109  CO2 GLUCOSE 117* 114* 140* 145*  BUN 70* 60* 57* 67*  CREATININE 2.79* 2.18* 2.76* 2.98*  CALCIUM 8.3* 8.2* 8.1* 8.2*  MG  --   --  2.1  --   PHOS 4.1 3.1 3.6 4.1    Liver Function Tests: Recent Labs  Lab 04/09/18 1759 04/10/18 2020 04/12/18 0711 04/13/18 0706  ALBUMIN 2.0* 2.0* 1.9* 1.9*   No results for input(s): LIPASE, AMYLASE in the last 168 hours. No results for input(s): AMMONIA in the last 168 hours.  CBC: Recent Labs  Lab 04/09/18 0153 04/10/18 2020 04/12/18 0711 04/13/18 0706  WBC 9.8 6.7 10.5 10.2  HGB 8.5* 9.5* 8.8* 9.0*  HCT 26.9* 31.5* 28.6*  29.1*  MCV 91.8 94.0 92.6 92.4  PLT 227 192 140* 170    Cardiac Enzymes: No results for input(s): CKTOTAL, CKMB, CKMBINDEX, TROPONINI in the last 168 hours.  BNP (last 3 results) No results for input(s): BNP in the last 8760 hours.  ProBNP (last 3 results) No results for input(s): PROBNP in the last 8760 hours.  Radiological Exams on Admission: Ir Fluoro Guide Cv Line Right  Result Date: 04/09/2018 CLINICAL DATA:  Need for non tunneled hemodialysis catheter. EXAM: NON-TUNNELED CENTRAL VENOUS HEMODIALYSIS CATHETER PLACEMENT WITH ULTRASOUND AND FLUOROSCOPIC GUIDANCE FLUOROSCOPY TIME:  18 SECONDS.  3.0 MGY. PROCEDURE: The procedure, risks, benefits, and alternatives were explained to the patient's wife. Questions regarding the procedure were encouraged and answered. The patient's wife understands and consents to the procedure. A time-out was performed prior to initiating the procedure. The right neck and chest were prepped with chlorhexidine in a sterile fashion, and a sterile drape was applied covering the operative field. Maximum barrier sterile technique with sterile gowns and gloves were used for the procedure. Local anesthesia was provided with 1% lidocaine. Ultrasound was utilized to confirm patency of the right internal jugular vein. After creating a small venotomy incision, a 19 gauge needle was advanced into the right internal jugular vein under direct, real-time ultrasound guidance. Ultrasound image documentation was performed. After securing guidewire access the venotomy was dilated. A 20 cm, 12 French triple lumen  Mahurkar non tunneled dialysis catheter was advanced over the wire. Final catheter positioning was confirmed and documented with a fluoroscopic spot image. The catheter was aspirated, flushed with saline, and injected with appropriate volume heparin dwells. The catheter exit site was secured with 0-Prolene retention sutures. COMPLICATIONS: None.  No pneumothorax. FINDINGS: After  catheter placement, the tip lies at the cavoatrial junction. The catheter aspirates normally and is ready for immediate use. IMPRESSION: Placement of non-tunneled central venous hemodialysis catheter via the right internal jugular vein. The catheter tip lies at the cavoatrial junction. The catheter is ready for immediate use. Electronically Signed   By: Irish Lack M.D.   On: 04/09/2018 16:39   Ir US Guide Vasc Access Right  Result Date: 04/09/2018 CLINICAL DATA:  Need for non tunneled hemodialysis catheter. EXAM: NON-TUNNELED CENTRAL VENOUS HEMODIALYSIS CATHETER PLACEMENT WITH ULTRASOUND AND FLUOROSCOPIC GUIDANCE FLUOROSCOPY TIME:  18 SECONDS.  3.0 MGY. PROCEDURE: The procedure, risks, benefits, and alternatives were explained to the patient's wife. Questions regarding the procedure were encouraged and answered. The patient's wife understands and consents to the procedure. A time-out was performed prior to initiating the procedure. The right neck and chest were prepped with chlorhexidine in a sterile fashion, and a sterile drape was applied covering the operative field. Maximum barrier sterile technique with sterile gowns and gloves were used for the procedure. Local anesthesia was provided with 1% lidocaine. Ultrasound was utilized to confirm patency of the right internal jugular vein. After creating a small venotomy incision, a 19 gauge needle was advanced into the right internal jugular vein under direct, real-time ultrasound guidance. Ultrasound image documentation was performed. After securing guidewire access the venotomy was dilated. A 20 cm, 12 French triple lumen Mahurkar non tunneled dialysis catheter was advanced over the wire. Final catheter positioning was confirmed and documented with a fluoroscopic spot image. The catheter was aspirated, flushed with saline, and injected with appropriate volume heparin dwells. The catheter exit site was secured with 0-Prolene retention sutures. COMPLICATIONS:  None.  No pneumothorax. FINDINGS: After catheter placement, the tip lies at the cavoatrial junction. The catheter aspirates normally and is ready for immediate use. IMPRESSION: Placement of non-tunneled central venous hemodialysis catheter via the right internal jugular vein. The catheter tip lies at the cavoatrial junction. The catheter is ready for immediate use. Electronically Signed   By: Irish Lack M.D.   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 Acute on chronic respiratory failure with hypoxia we will continue with T collar weaning as tolerated hopefully will assess for PMV and capping soon 2. Pneumonia due to aspiration treated with antibiotics we will continue with supportive care 3. Acute tubular necrosis and renal failure patient being dialyzed we will continue supportive care 4. Pulmonary embolism at baseline we will continue to monitor 5. Stroke continue with restorative therapy and physical therapy as tolerated 6. Chronic atrial fibrillation rate is controlled we will monitor   I have personally seen and evaluated the patient, evaluated laboratory and imaging results, formulated the assessment and plan and placed orders. The Patient requires high complexity decision making for assessment and support.  Case was discussed on  Rounds with the Respiratory Therapy Staff  Allyne Gee, MD [facility] Pulmonary Critical Care Medicine Sleep Medicine

## 2018-04-13 NOTE — Progress Notes (Signed)
[name]  ROUNDING NOTE   Subjective:  Patient seen at bedside. It appears urine output increasing and over the preceding 24 hours urine output was up to 1.4 L. Mental status not significantly improved. Still has low-grade fever. However most recent blood cultures are negative.  Objective:  Vital signs in last 24 hours:  Temperature 100.8 pulse 75 respirations 25 blood pressure 145/71  Physical Exam: General: No acute distress  Head: Normocephalic, atraumatic. Moist oral mucosal membranes  Eyes: Anicteric  Neck: Tracheostomy present  Lungs:  Scattered rhonchi, normal effort  Heart: S1S2 no rubs  Abdomen:  Soft, nontender, bowel sounds present, PEG present  Extremities: 1+ peripheral edema.  Neurologic: Arousable, not following commands  Skin: No lesions  Access: Right internal jugular temporary dialysis catheter placed 04/09/2018    Basic Metabolic Panel: Recent Labs  Lab 04/09/18 1759 04/10/18 2020 04/12/18 0711 04/13/18 0706  NA 146* 148* 143 145  K 3.9 3.8 3.3* 3.2*  CL 108 111 105 109  CO2 GLUCOSE 117* 114* 140* 145*  BUN 70* 60* 57* 67*  CREATININE 2.79* 2.18* 2.76* 2.98*  CALCIUM 8.3* 8.2* 8.1* 8.2*  MG  --   --  2.1  --   PHOS 4.1 3.1 3.6 4.1    Liver Function Tests: Recent Labs  Lab 04/09/18 1759 04/10/18 2020 04/12/18 0711 04/13/18 0706  ALBUMIN 2.0* 2.0* 1.9* 1.9*   No results for input(s): LIPASE, AMYLASE in the last 168 hours. No results for input(s): AMMONIA in the last 168 hours.  CBC: Recent Labs  Lab 04/09/18 0153 04/10/18 2020 04/12/18 0711 04/13/18 0706  WBC 9.8 6.7 10.5 10.2  HGB 8.5* 9.5* 8.8* 9.0*  HCT 26.9* 31.5* 28.6* 29.1*  MCV 91.8 94.0 92.6 92.4  PLT 227 192 140* 170    Cardiac Enzymes: No results for input(s): CKTOTAL, CKMB, CKMBINDEX, TROPONINI in the last 168 hours.  BNP: Invalid input(s): POCBNP  CBG: No results for input(s): GLUCAP in the last 168  hours.  Microbiology: Results for orders placed or performed during the hospital encounter of 04/04/2018  C difficile quick scan w PCR reflex     Status: None   Collection Time: 04/24/2018  3:01 PM  Result Value Ref Range Status   C Diff antigen NEGATIVE NEGATIVE Final   C Diff toxin NEGATIVE NEGATIVE Final   C Diff interpretation No C. difficile detected.  Final    Comment: Performed at [facility], [address], [place], [place] [address]  Culture, Urine     Status: None   Collection Time: 03/29/18 10:45 PM  Result Value Ref Range Status   Specimen Description URINE, RANDOM  Final   Special Requests NONE  Final   Culture   Final    NO GROWTH Performed at [facility], [address], [place], [place] [address]    Report Status 03/31/2018 FINAL  Final  Culture, respiratory (NON-Expectorated)     Status: None   Collection Time: 03/30/18  8:55 AM  Result Value Ref Range Status   Specimen Description TRACHEAL ASPIRATE  Final   Special Requests NONE  Final   Gram Stain   Final    MODERATE WBC PRESENT, PREDOMINANTLY PMN MODERATE SQUAMOUS EPITHELIAL CELLS PRESENT FEW GRAM POSITIVE COCCI FEW GRAM POSITIVE RODS    Culture   Final    Consistent with normal respiratory flora. Performed at [facility], [address] [place], [place] [address]  Report Status 04/01/2018 FINAL  Final  Culture, respiratory (NON-Expectorated)     Status: None   Collection Time: 04/05/18  4:14 PM  Result Value Ref Range Status   Specimen Description TRACHEAL ASPIRATE  Final   Special Requests NONE  Final   Gram Stain   Final    ABUNDANT WBC PRESENT, PREDOMINANTLY PMN MODERATE GRAM POSITIVE COCCI MODERATE GRAM VARIABLE ROD    Culture   Final    MODERATE Consistent with normal respiratory flora. Performed at [facility], [address], [place], [place] [address]    Report Status 04/07/2018 FINAL  Final  Culture, Urine     Status: None   Collection Time: 04/05/18  4:45  PM  Result Value Ref Range Status   Specimen Description URINE, RANDOM  Final   Special Requests NONE  Final   Culture   Final    NO GROWTH Performed at [facility], [address] [place], [place] [address]    Report Status 04/06/2018 FINAL  Final  Culture, blood (routine x 2)     Status: None   Collection Time: 04/06/18  3:19 PM  Result Value Ref Range Status   Specimen Description BLOOD RIGHT HAND  Final   Special Requests   Final    BOTTLES DRAWN AEROBIC ONLY Blood Culture adequate volume   Culture   Final    NO GROWTH 5 DAYS Performed at [facility], [address], [place], [place] [address]    Report Status 04/11/2018 FINAL  Final  Culture, blood (routine x 2)     Status: Abnormal   Collection Time: 04/06/18  3:28 PM  Result Value Ref Range Status   Specimen Description BLOOD LEFT HAND  Final   Special Requests   Final    BOTTLES DRAWN AEROBIC AND ANAEROBIC Blood Culture adequate volume   Culture  Setup Time   Final    GRAM POSITIVE COCCI IN CLUSTERS IN BOTH AEROBIC AND ANAEROBIC BOTTLES GRAM POSITIVE RODS AEROBIC BOTTLE ONLY CRITICAL RESULT CALLED TO, READ BACK BY AND VERIFIED WITH: Jannett Celestine [id number] 04/07/2018 T. TYSOR    Culture (A)  Final    BACILLUS SPECIES Standardized susceptibility testing for this organism is not available. STAPHYLOCOCCUS SPECIES (COAGULASE NEGATIVE) THE SIGNIFICANCE OF ISOLATING THIS ORGANISM FROM A SINGLE SET OF BLOOD CULTURES WHEN MULTIPLE SETS ARE DRAWN IS UNCERTAIN. PLEASE NOTIFY THE MICROBIOLOGY DEPARTMENT WITHIN ONE WEEK IF SPECIATION AND SENSITIVITIES ARE REQUIRED. Performed at [facility], [address] [place], [place] [address]    Report Status 04/09/2018 FINAL  Final  Blood Culture ID Panel (Reflexed)     Status: Abnormal   Collection Time: 04/06/18  3:28 PM  Result Value Ref Range Status   Enterococcus species NOT DETECTED NOT DETECTED Final   Listeria monocytogenes NOT DETECTED NOT DETECTED Final    Staphylococcus species DETECTED (A) NOT DETECTED Final    Comment: Methicillin (oxacillin) resistant coagulase negative staphylococcus. Possible blood culture contaminant (unless isolated from more than one blood culture draw or clinical case suggests pathogenicity). No antibiotic treatment is indicated for blood  culture contaminants. CRITICAL RESULT CALLED TO, READ BACK BY AND VERIFIED WITH: N. OLUBODUN,RN [id number] 04/07/2018 T. TYSOR    Staphylococcus aureus NOT DETECTED NOT DETECTED Final   Methicillin resistance DETECTED (A) NOT DETECTED Final    Comment: CRITICAL RESULT CALLED TO, READ BACK BY AND VERIFIED WITH: N. OLUBODUN,RN [id number] 04/07/2018 T. TYSOR    Streptococcus species NOT DETECTED  NOT DETECTED Final   Streptococcus agalactiae NOT DETECTED NOT DETECTED Final   Streptococcus pneumoniae NOT DETECTED NOT DETECTED Final   Streptococcus pyogenes NOT DETECTED NOT DETECTED Final   Acinetobacter baumannii NOT DETECTED NOT DETECTED Final   Enterobacteriaceae species NOT DETECTED NOT DETECTED Final   Enterobacter cloacae complex NOT DETECTED NOT DETECTED Final   Escherichia coli NOT DETECTED NOT DETECTED Final   Klebsiella oxytoca NOT DETECTED NOT DETECTED Final   Klebsiella pneumoniae NOT DETECTED NOT DETECTED Final   Proteus species NOT DETECTED NOT DETECTED Final   Serratia marcescens NOT DETECTED NOT DETECTED Final   Haemophilus influenzae NOT DETECTED NOT DETECTED Final   Neisseria meningitidis NOT DETECTED NOT DETECTED Final   Pseudomonas aeruginosa NOT DETECTED NOT DETECTED Final   Candida albicans NOT DETECTED NOT DETECTED Final   Candida glabrata NOT DETECTED NOT DETECTED Final   Candida krusei NOT DETECTED NOT DETECTED Final   Candida parapsilosis NOT DETECTED NOT DETECTED Final   Candida tropicalis NOT DETECTED NOT DETECTED Final  Culture, blood (routine x 2)     Status: None (Preliminary result)   Collection Time: 04/10/18 10:35 AM  Result Value Ref Range Status    Specimen Description BLOOD RIGHT HAND  Final   Special Requests   Final    BOTTLES DRAWN AEROBIC ONLY Blood Culture results may not be optimal due to an inadequate volume of blood received in culture bottles   Culture  Setup Time   Final    AEROBIC BOTTLE ONLY GRAM POSITIVE COCCI CRITICAL VALUE NOTED.  VALUE IS CONSISTENT WITH PREVIOUSLY REPORTED AND CALLED VALUE. Performed at [facility], [address], [place], [place] [address]    Culture GRAM POSITIVE COCCI  Final   Report Status PENDING  Incomplete  Culture, blood (routine x 2)     Status: None (Preliminary result)   Collection Time: 04/10/18 10:42 AM  Result Value Ref Range Status   Specimen Description BLOOD RIGHT ARM  Final   Special Requests   Final    BOTTLES DRAWN AEROBIC ONLY Blood Culture results may not be optimal due to an inadequate volume of blood received in culture bottles   Culture   Final    NO GROWTH 2 DAYS Performed at [facility], [address], [place], [place] [address]    Report Status PENDING  Incomplete  Culture, respiratory (NON-Expectorated)     Status: None   Collection Time: 04/10/18  3:15 PM  Result Value Ref Range Status   Specimen Description TRACHEAL ASPIRATE  Final   Special Requests NONE  Final   Gram Stain   Final    ABUNDANT WBC PRESENT,BOTH PMN AND MONONUCLEAR FEW GRAM POSITIVE COCCI MODERATE GRAM VARIABLE ROD    Culture   Final    Consistent with normal respiratory flora. Performed at [facility], [address], [place], [place] [address]    Report Status 04/12/2018 FINAL  Final  Culture, Urine     Status: Abnormal   Collection Time: 04/10/18  4:15 PM  Result Value Ref Range Status   Specimen Description URINE, RANDOM  Final   Special Requests   Final    NONE Performed at [facility], [address] [place], [place] [address]    Culture MULTIPLE SPECIES PRESENT, SUGGEST RECOLLECTION (A)  Final   Report Status 04/11/2018 FINAL  Final     Coagulation Studies: Recent Labs    04/11/18 0643 04/12/18 0711 04/13/18 0706  LABPROT  30.9* 24.6* 18.4*  INR 3.00 2.24 1.55    Urinalysis: Recent Labs    04/10/18 1615  COLORURINE YELLOW  LABSPEC 1.015  PHURINE 6.0  GLUCOSEU NEGATIVE  HGBUR LARGE*  BILIRUBINUR NEGATIVE  KETONESUR NEGATIVE  PROTEINUR 100*  NITRITE NEGATIVE  LEUKOCYTESUR LARGE*      Imaging: No results found.   Medications:       Assessment/ Plan:  [name]  with a PMHx of diabetes mellitus type 2, osteoarthritis, who was admitted to [facility] on 04/14/2018 for ongoing treatment CVA with basilar occlusion on March 10, 2018 status post basilar artery angioplasty on March 10, 2018.  Patient initially presented outside hospital with facial droop as well as dysarthria.  During prior hospitalization he also developed acute respiratory failure and is status post tracheostomy placement as well as PEG tube placement.  In addition he had worsening renal function during the course of the hospitalization and temporary left internal jugular dialysis catheter was placed and he was initiated on dialysis immediately before transfer here.   1.  Acute renal failure secondary to ATN. 2.  Acute respiratory failure. 3.  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 4.  Anemia unspecified. 5.  MRSA sepsis secondary to dialysis catheter.  Plan: Patient still febrile at this point time.  Most recent blood cultures were negative however.  We will go ahead and discontinue dialysis catheter at this time as urine output is increased to 1.4 L.  Hold any further dialysis for now and reevaluate the patient's renal function on Monday.  No significant improvements noted in mental status however.  Overall prognosis guarded.  LOS: 0 Kelsie Kramp 5/17/20198:49 AM

## 2018-04-14 DIAGNOSIS — J9621 Acute and chronic respiratory failure with hypoxia: Secondary | ICD-10-CM | POA: Diagnosis not present

## 2018-04-14 DIAGNOSIS — N170 Acute kidney failure with tubular necrosis: Secondary | ICD-10-CM | POA: Diagnosis not present

## 2018-04-14 DIAGNOSIS — J690 Pneumonitis due to inhalation of food and vomit: Secondary | ICD-10-CM | POA: Diagnosis not present

## 2018-04-14 DIAGNOSIS — I2699 Other pulmonary embolism without acute cor pulmonale: Secondary | ICD-10-CM | POA: Diagnosis not present

## 2018-04-14 LAB — CULTURE, BLOOD (ROUTINE X 2): Special Requests: ADEQUATE

## 2018-04-14 LAB — PROTIME-INR
INR: 1.45
PROTHROMBIN TIME: 17.5 s — AB (ref 11.4–15.2)

## 2018-04-14 LAB — VANCOMYCIN, TROUGH: Vancomycin Tr: 12 ug/mL — ABNORMAL LOW (ref 15–20)

## 2018-04-14 NOTE — Progress Notes (Signed)
Pulmonary Critical Care Medicine [facility] [place]   PULMONARY SERVICE  PROGRESS NOTE  Date of Service: 04/14/2018  Curtis Hopkins  ION:[id number]  DOB: [birth year]   DOA: 2018-04-13  Referring Physician: Carron Curie, MD  HPI: Curtis Hopkins is a 72 y.o. male seen for follow up of Acute on Chronic Respiratory Failure.  Patient is doing well on weaning T collar.  Neurologically not much in the way of being responsive.  Comfortable without distress at this time.  Medications: Reviewed on Rounds  Physical Exam:  Vitals: Temperature 100.2 pulse 87 respiratory rate 24 blood pressure 146/80 saturations 94%  Ventilator Settings on T collar FiO2 28%  . General: Comfortable at this time . Eyes: Grossly normal lids, irises & conjunctiva . ENT: grossly tongue is normal . Neck: no obvious mass . Cardiovascular: S1 S2 normal no gallop . Respiratory: Scattered rhonchi expansion is equal . Abdomen: soft . Skin: no rash seen on limited exam . Musculoskeletal: not rigid . Psychiatric:unable to assess . Neurologic: no seizure no involuntary movements         Labs on Admission:  Basic Metabolic Panel: Recent Labs  Lab 04/09/18 1759 04/10/18 2020 04/12/18 0711 04/13/18 0706  NA 146* 148* 143 145  K 3.9 3.8 3.3* 3.2*  CL 108 111 105 109  CO2 GLUCOSE 117* 114* 140* 145*  BUN 70* 60* 57* 67*  CREATININE 2.79* 2.18* 2.76* 2.98*  CALCIUM 8.3* 8.2* 8.1* 8.2*  MG  --   --  2.1  --   PHOS 4.1 3.1 3.6 4.1    Liver Function Tests: Recent Labs  Lab 04/09/18 1759 04/10/18 2020 04/12/18 0711 04/13/18 0706  ALBUMIN 2.0* 2.0* 1.9* 1.9*   No results for input(s): LIPASE, AMYLASE in the last 168 hours. No results for input(s): AMMONIA in the last 168 hours.  CBC: Recent Labs  Lab 04/09/18 0153 04/10/18 2020 04/12/18 0711 04/13/18 0706  WBC 9.8 6.7 10.5 10.2  HGB 8.5* 9.5* 8.8* 9.0*  HCT 26.9* 31.5* 28.6* 29.1*  MCV 91.8 94.0 92.6 92.4  PLT  227 192 140* 170    Cardiac Enzymes: No results for input(s): CKTOTAL, CKMB, CKMBINDEX, TROPONINI in the last 168 hours.  BNP (last 3 results) No results for input(s): BNP in the last 8760 hours.  ProBNP (last 3 results) No results for input(s): PROBNP in the last 8760 hours.  Radiological Exams on Admission: Ir Patient Eval Tech 0-60 Mins  Result Date: 04/13/2018 Herbie Baltimore     04/13/2018  2:31 PM 04/13/18 1400.  Went to bedside to remove non-tunneled dialysis catheter approved by Dr. Oley Balm. Dressing removed after discussion with RN and confirmation of INR of 1.55. Stitch removed and catheter d/c'd.  Pressure held for 7 minutes and dressing applied.   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 Acute on chronic respiratory failure with hypoxia we will continue with full supportive care continue pulmonary toilet secretion management 2. Chronic atrial fibrillation rate is controlled at this time we will continue to follow 3. Stroke patient remains grossly unchanged nonresponsive. 4. Pneumonia due to aspiration will continue with present management 5. Pulmonary  embolism continue with supportive care 6. Severe sepsis with shock resolved 7. Acute renal failure tubular necrosis followed by nephrology will continue with dialysis as needed 8. Aspiration pneumonia treated seems to be clinically improving   I have personally seen and evaluated the patient, evaluated laboratory and imaging results, formulated the assessment and plan and placed orders. The Patient requires high complexity decision making for assessment and support.  Case was discussed on Rounds with the  Respiratory Therapy Staff  Yevonne Pax, MD [facility] Pulmonary Critical Care Medicine Sleep Medicine

## 2018-04-15 LAB — URINE CULTURE: CULTURE: NO GROWTH

## 2018-04-15 LAB — CULTURE, BLOOD (ROUTINE X 2): Culture: NO GROWTH

## 2018-04-15 LAB — PROTIME-INR
INR: 1.48
Prothrombin Time: 17.8 seconds — ABNORMAL HIGH (ref 11.4–15.2)

## 2018-04-16 ENCOUNTER — Other Ambulatory Visit (HOSPITAL_COMMUNITY): Payer: Self-pay

## 2018-04-16 DIAGNOSIS — I482 Chronic atrial fibrillation: Secondary | ICD-10-CM | POA: Diagnosis not present

## 2018-04-16 DIAGNOSIS — J690 Pneumonitis due to inhalation of food and vomit: Secondary | ICD-10-CM | POA: Diagnosis not present

## 2018-04-16 DIAGNOSIS — N170 Acute kidney failure with tubular necrosis: Secondary | ICD-10-CM | POA: Diagnosis not present

## 2018-04-16 DIAGNOSIS — J9621 Acute and chronic respiratory failure with hypoxia: Secondary | ICD-10-CM | POA: Diagnosis not present

## 2018-04-16 LAB — RENAL FUNCTION PANEL
ALBUMIN: 2 g/dL — AB (ref 3.5–5.0)
Albumin: 2 g/dL — ABNORMAL LOW (ref 3.5–5.0)
Anion gap: 12 (ref 5–15)
Anion gap: 12 (ref 5–15)
BUN: 78 mg/dL — AB (ref 6–20)
BUN: 79 mg/dL — ABNORMAL HIGH (ref 6–20)
CHLORIDE: 114 mmol/L — AB (ref 101–111)
CO2: 23 mmol/L (ref 22–32)
CO2: 25 mmol/L (ref 22–32)
CREATININE: 2.34 mg/dL — AB (ref 0.61–1.24)
CREATININE: 2.32 mg/dL — AB (ref 0.61–1.24)
Calcium: 8.2 mg/dL — ABNORMAL LOW (ref 8.9–10.3)
Calcium: 8.3 mg/dL — ABNORMAL LOW (ref 8.9–10.3)
Chloride: 116 mmol/L — ABNORMAL HIGH (ref 101–111)
GFR calc Af Amer: 31 mL/min — ABNORMAL LOW (ref >60)
GFR calc Af Amer: 31 mL/min — ABNORMAL LOW (ref >60)
GFR, EST NON AFRICAN AMERICAN: 27 mL/min — AB (ref >60)
GFR, EST NON AFRICAN AMERICAN: 27 mL/min — AB (ref >60)
GLUCOSE: 133 mg/dL — AB (ref 65–99)
Glucose, Bld: 149 mg/dL — ABNORMAL HIGH (ref 65–99)
POTASSIUM: 3.7 mmol/L (ref 3.5–5.1)
POTASSIUM: 3.5 mmol/L (ref 3.5–5.1)
Phosphorus: 4.3 mg/dL (ref 2.5–4.6)
Phosphorus: 4.2 mg/dL (ref 2.5–4.6)
SODIUM: 151 mmol/L — AB (ref 135–145)
Sodium: 151 mmol/L — ABNORMAL HIGH (ref 135–145)

## 2018-04-16 LAB — CBC
HCT: 31.1 % — ABNORMAL LOW (ref 39.0–52.0)
HEMATOCRIT: 30.5 % — AB (ref 39.0–52.0)
Hemoglobin: 9.3 g/dL — ABNORMAL LOW (ref 13.0–17.0)
Hemoglobin: 9.3 g/dL — ABNORMAL LOW (ref 13.0–17.0)
MCH: 28.5 pg (ref 26.0–34.0)
MCH: 28.9 pg (ref 26.0–34.0)
MCHC: 29.9 g/dL — AB (ref 30.0–36.0)
MCHC: 30.5 g/dL (ref 30.0–36.0)
MCV: 95.4 fL (ref 78.0–100.0)
MCV: 94.7 fL (ref 78.0–100.0)
PLATELETS: 178 10*3/uL (ref 150–400)
PLATELETS: 215 10*3/uL (ref 150–400)
RBC: 3.26 MIL/uL — ABNORMAL LOW (ref 4.22–5.81)
RBC: 3.22 MIL/uL — ABNORMAL LOW (ref 4.22–5.81)
RDW: 16.3 % — AB (ref 11.5–15.5)
RDW: 16.2 % — ABNORMAL HIGH (ref 11.5–15.5)
WBC: 10.7 10*3/uL — ABNORMAL HIGH (ref 4.0–10.5)
WBC: 11.1 10*3/uL — AB (ref 4.0–10.5)

## 2018-04-16 LAB — PROTIME-INR
INR: 1.64
Prothrombin Time: 19.3 seconds — ABNORMAL HIGH (ref 11.4–15.2)

## 2018-04-16 LAB — MAGNESIUM
MAGNESIUM: 2.3 mg/dL (ref 1.7–2.4)
MAGNESIUM: 2.2 mg/dL (ref 1.7–2.4)

## 2018-04-16 NOTE — Progress Notes (Signed)
Pulmonary Critical Care Medicine [facility] [place]   PULMONARY SERVICE  PROGRESS NOTE  Date of Service: 04/16/2018  Curtis Hopkins  ZOX:[id number]  DOB: [birth year]   DOA: 04/12/2018  Referring Physician: Carron Curie, MD  HPI: Curtis Hopkins is a 72 y.o. male seen for follow up of Acute on Chronic Respiratory Failure.  Right now is on T collar trials has been doing fine.  His entire family was present in the room.  I have explained to them that he is not likely to make much meaningful neurological recovery.  Patient continues to be followed with nephrology has been having fairly good urine output  Medications: Reviewed on Rounds  Physical Exam:  Vitals: Temperature 97.5 pulse 80 respiratory rate 22 blood pressure 134/66 saturations 98%  Ventilator Settings off the ventilator on T collar  . General: Comfortable at this time . Eyes: Grossly normal lids, irises & conjunctiva . ENT: grossly tongue is normal . Neck: no obvious mass . Cardiovascular: S1 S2 normal no gallop . Respiratory: Coarse breath sounds no rhonchi . Abdomen: soft . Skin: no rash seen on limited exam . Musculoskeletal: not rigid . Psychiatric:unable to assess . Neurologic: no seizure no involuntary movements         Labs on Admission:  Basic Metabolic Panel: Recent Labs  Lab 04/10/18 2020 04/12/18 0711 04/13/18 0706 04/16/18 0638 04/16/18 0904  NA 148* 143 145 151* 151*  K 3.8 3.3* 3.2* 3.7 3.5  CL 111 105 109 116* 114*  CO2 GLUCOSE 114* 140* 145* 133* 149*  BUN 60* 57* 67* 78* 79*  CREATININE 2.18* 2.76* 2.98* 2.34* 2.32*  CALCIUM 8.2* 8.1* 8.2* 8.2* 8.3*  MG  --  2.1  --  2.3 2.2  PHOS 3.1 3.6 4.1 4.3 4.2    Liver Function Tests: Recent Labs  Lab 04/10/18 2020 04/12/18 0711 04/13/18 0706 04/16/18 0638 04/16/18 0904  ALBUMIN 2.0* 1.9* 1.9* 2.0* 2.0*   No results for input(s): LIPASE, AMYLASE in the last 168 hours. No results for input(s): AMMONIA  in the last 168 hours.  CBC: Recent Labs  Lab 04/10/18 2020 04/12/18 0711 04/13/18 0706 04/16/18 0638 04/16/18 0904  WBC 6.7 10.5 10.2 10.7* 11.1*  HGB 9.5* 8.8* 9.0* 9.3* 9.3*  HCT 31.5* 28.6* 29.1* 31.1* 30.5*  MCV 94.0 92.6 92.4 95.4 94.7  PLT 192 140* 170 178 215    Cardiac Enzymes: No results for input(s): CKTOTAL, CKMB, CKMBINDEX, TROPONINI in the last 168 hours.  BNP (last 3 results) No results for input(s): BNP in the last 8760 hours.  ProBNP (last 3 results) No results for input(s): PROBNP in the last 8760 hours.  Radiological Exams on Admission: Ir Patient Eval Tech 0-60 Mins  Result Date: 04/13/2018 Herbie Baltimore     04/13/2018  2:31 PM 04/13/18 1400.  Went to bedside to remove non-tunneled dialysis catheter approved by Dr. Oley Balm. Dressing removed after discussion with RN and confirmation of INR of 1.55. Stitch removed and catheter d/c'd.  Pressure held for 7 minutes and dressing applied.   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 Acute on chronic respiratory failure with hypoxia continue with full supportive  care patient is going to continue on T collar trials as ordered. 2. Chronic atrial fibrillation rate is controlled we will continue to follow along 3. Stroke patient is at baseline remains nonverbal. 4. Aspiration pneumonia we will continue to monitor for any further aspiration events. 5. Severe sepsis with shock hemodynamically stable clinically resolved 6. Acute tubular necrosis labs are improving urine output improving   I have personally seen and evaluated the patient, evaluated laboratory and imaging results, formulated the assessment and plan and placed  orders. The Patient requires high complexity decision making for assessment and support.  Case was discussed on Rounds with the Respiratory Therapy Staff  Yevonne Pax, MD [facility] Pulmonary Critical Care Medicine Sleep Medicine

## 2018-04-16 NOTE — Progress Notes (Signed)
[name]  ROUNDING NOTE   Subjective:  Patient continues to have good urine output at the moment. Urine output was 1.4 L over the preceding 24 hours. New renal function panel to be drawn today.  Objective:  Vital signs in last 24 hours:  Temperature 99.5 pulse 80 respirations 22 blood pressure 144/71  Physical Exam: General: No acute distress  Head: Normocephalic, atraumatic. Moist oral mucosal membranes  Eyes: Anicteric  Neck: Tracheostomy present  Lungs:  Scattered rhonchi, normal effort  Heart: S1S2 no rubs  Abdomen:  Soft, nontender, bowel sounds present, PEG present  Extremities: no peripheral edema.  Neurologic: Arousable, not following commands  Skin: No lesions  Access: Dialysis catheter removed    Basic Metabolic Panel: Recent Labs  Lab 04/09/18 1759 04/10/18 2020 04/12/18 0711 04/13/18 0706  NA 146* 148* 143 145  K 3.9 3.8 3.3* 3.2*  CL 108 111 105 109  CO2 GLUCOSE 117* 114* 140* 145*  BUN 70* 60* 57* 67*  CREATININE 2.79* 2.18* 2.76* 2.98*  CALCIUM 8.3* 8.2* 8.1* 8.2*  MG  --   --  2.1  --   PHOS 4.1 3.1 3.6 4.1    Liver Function Tests: Recent Labs  Lab 04/09/18 1759 04/10/18 2020 04/12/18 0711 04/13/18 0706  ALBUMIN 2.0* 2.0* 1.9* 1.9*   No results for input(s): LIPASE, AMYLASE in the last 168 hours. No results for input(s): AMMONIA in the last 168 hours.  CBC: Recent Labs  Lab 04/10/18 2020 04/12/18 0711 04/13/18 0706 04/16/18 0638  WBC 6.7 10.5 10.2 10.7*  HGB 9.5* 8.8* 9.0* 9.3*  HCT 31.5* 28.6* 29.1* 31.1*  MCV 94.0 92.6 92.4 95.4  PLT 192 140* 170 178    Cardiac Enzymes: No results for input(s): CKTOTAL, CKMB, CKMBINDEX, TROPONINI in the last 168 hours.  BNP: Invalid input(s): POCBNP  CBG: No results for input(s): GLUCAP in the last 168 hours.  Microbiology: Results for orders placed or performed during the hospital encounter of 04/07/2018  C difficile quick scan w PCR reflex     Status:  None   Collection Time: 04/24/2018  3:01 PM  Result Value Ref Range Status   C Diff antigen NEGATIVE NEGATIVE Final   C Diff toxin NEGATIVE NEGATIVE Final   C Diff interpretation No C. difficile detected.  Final    Comment: Performed at [facility], [address] [place], [place] [address]  Culture, Urine     Status: None   Collection Time: 03/29/18 10:45 PM  Result Value Ref Range Status   Specimen Description URINE, RANDOM  Final   Special Requests NONE  Final   Culture   Final    NO GROWTH Performed at [facility], [address], [place], [place] [address]    Report Status 03/31/2018 FINAL  Final  Culture, respiratory (NON-Expectorated)     Status: None   Collection Time: 03/30/18  8:55 AM  Result Value Ref Range Status   Specimen Description TRACHEAL ASPIRATE  Final   Special Requests NONE  Final   Gram Stain   Final    MODERATE WBC PRESENT, PREDOMINANTLY PMN MODERATE SQUAMOUS EPITHELIAL CELLS PRESENT FEW GRAM POSITIVE COCCI FEW GRAM POSITIVE RODS    Culture   Final    Consistent with normal respiratory flora. Performed at [facility], [address] [place], [place] [address]    Report Status 04/01/2018 FINAL  Final  Culture, respiratory (NON-Expectorated)  Status: None   Collection Time: 04/05/18  4:14 PM  Result Value Ref Range Status   Specimen Description TRACHEAL ASPIRATE  Final   Special Requests NONE  Final   Gram Stain   Final    ABUNDANT WBC PRESENT, PREDOMINANTLY PMN MODERATE GRAM POSITIVE COCCI MODERATE GRAM VARIABLE ROD    Culture   Final    MODERATE Consistent with normal respiratory flora. Performed at [facility], [address], [place], [place] [address]    Report Status 04/07/2018 FINAL  Final  Culture, Urine     Status: None   Collection Time: 04/05/18  4:45 PM  Result Value Ref Range Status   Specimen Description URINE, RANDOM  Final   Special Requests NONE  Final   Culture   Final    NO GROWTH Performed  at [facility], [address] [place], [place] [address]    Report Status 04/06/2018 FINAL  Final  Culture, blood (routine x 2)     Status: None   Collection Time: 04/06/18  3:19 PM  Result Value Ref Range Status   Specimen Description BLOOD RIGHT HAND  Final   Special Requests   Final    BOTTLES DRAWN AEROBIC ONLY Blood Culture adequate volume   Culture   Final    NO GROWTH 5 DAYS Performed at [facility], [address] [place], [place] [address]    Report Status 04/11/2018 FINAL  Final  Culture, blood (routine x 2)     Status: Abnormal   Collection Time: 04/06/18  3:28 PM  Result Value Ref Range Status   Specimen Description BLOOD LEFT HAND  Final   Special Requests   Final    BOTTLES DRAWN AEROBIC AND ANAEROBIC Blood Culture adequate volume   Culture  Setup Time   Final    GRAM POSITIVE COCCI IN CLUSTERS IN BOTH AEROBIC AND ANAEROBIC BOTTLES GRAM POSITIVE RODS AEROBIC BOTTLE ONLY CRITICAL RESULT CALLED TO, READ BACK BY AND VERIFIED WITH: Jannett Celestine [id number] 04/07/2018 T. TYSOR    Culture (A)  Final    BACILLUS SPECIES Standardized susceptibility testing for this organism is not available. STAPHYLOCOCCUS SPECIES (COAGULASE NEGATIVE) SUSCEPTIBILITIES PERFORMED ON PREVIOUS CULTURE WITHIN THE LAST 5 DAYS. Performed at [facility], [address], [place], [place] [address]    Report Status 04/14/2018 FINAL  Final  Blood Culture ID Panel (Reflexed)     Status: Abnormal   Collection Time: 04/06/18  3:28 PM  Result Value Ref Range Status   Enterococcus species NOT DETECTED NOT DETECTED Final   Listeria monocytogenes NOT DETECTED NOT DETECTED Final   Staphylococcus species DETECTED (A) NOT DETECTED Final    Comment: Methicillin (oxacillin) resistant coagulase negative staphylococcus. Possible blood culture contaminant (unless isolated from more than one blood culture draw or clinical case suggests pathogenicity). No antibiotic treatment is indicated for  blood  culture contaminants. CRITICAL RESULT CALLED TO, READ BACK BY AND VERIFIED WITH: N. OLUBODUN,RN 0708 04/07/2018 T. TYSOR    Staphylococcus aureus NOT DETECTED NOT DETECTED Final   Methicillin resistance DETECTED (A) NOT DETECTED Final    Comment: CRITICAL RESULT CALLED TO, READ BACK BY AND VERIFIED WITH: N. OLUBODUN,RN 0708 04/07/2018 T. TYSOR    Streptococcus species NOT DETECTED NOT DETECTED Final   Streptococcus agalactiae NOT DETECTED NOT DETECTED Final   Streptococcus pneumoniae NOT DETECTED NOT DETECTED Final   Streptococcus pyogenes NOT DETECTED NOT DETECTED Final   Acinetobacter baumannii NOT DETECTED NOT DETECTED  Final   Enterobacteriaceae species NOT DETECTED NOT DETECTED Final   Enterobacter cloacae complex NOT DETECTED NOT DETECTED Final   Escherichia coli NOT DETECTED NOT DETECTED Final   Klebsiella oxytoca NOT DETECTED NOT DETECTED Final   Klebsiella pneumoniae NOT DETECTED NOT DETECTED Final   Proteus species NOT DETECTED NOT DETECTED Final   Serratia marcescens NOT DETECTED NOT DETECTED Final   Haemophilus influenzae NOT DETECTED NOT DETECTED Final   Neisseria meningitidis NOT DETECTED NOT DETECTED Final   Pseudomonas aeruginosa NOT DETECTED NOT DETECTED Final   Candida albicans NOT DETECTED NOT DETECTED Final   Candida glabrata NOT DETECTED NOT DETECTED Final   Candida krusei NOT DETECTED NOT DETECTED Final   Candida parapsilosis NOT DETECTED NOT DETECTED Final   Candida tropicalis NOT DETECTED NOT DETECTED Final  Culture, blood (routine x 2)     Status: Abnormal   Collection Time: 04/10/18 10:35 AM  Result Value Ref Range Status   Specimen Description BLOOD RIGHT HAND  Final   Special Requests   Final    BOTTLES DRAWN AEROBIC ONLY Blood Culture results may not be optimal due to an inadequate volume of blood received in culture bottles   Culture  Setup Time   Final    AEROBIC BOTTLE ONLY GRAM POSITIVE COCCI CRITICAL VALUE NOTED.  VALUE IS CONSISTENT WITH  PREVIOUSLY REPORTED AND CALLED VALUE. Performed at [facility], [address], [place], [place] [address]    Culture STAPHYLOCOCCUS SPECIES (COAGULASE NEGATIVE) (A)  Final   Report Status 04/14/2018 FINAL  Final   Organism ID, Bacteria STAPHYLOCOCCUS SPECIES (COAGULASE NEGATIVE)  Final      Susceptibility   Staphylococcus species (coagulase negative) - MIC*    CIPROFLOXACIN >=8 RESISTANT Resistant     ERYTHROMYCIN >=8 RESISTANT Resistant     GENTAMICIN 8 INTERMEDIATE Intermediate     OXACILLIN >=4 RESISTANT Resistant     TETRACYCLINE 2 SENSITIVE Sensitive     VANCOMYCIN 2 SENSITIVE Sensitive     TRIMETH/SULFA 80 RESISTANT Resistant     CLINDAMYCIN >=8 RESISTANT Resistant     RIFAMPIN <=0.5 SENSITIVE Sensitive     Inducible Clindamycin NEGATIVE Sensitive     * STAPHYLOCOCCUS SPECIES (COAGULASE NEGATIVE)  Culture, blood (routine x 2)     Status: None   Collection Time: 04/10/18 10:42 AM  Result Value Ref Range Status   Specimen Description BLOOD RIGHT ARM  Final   Special Requests   Final    BOTTLES DRAWN AEROBIC ONLY Blood Culture results may not be optimal due to an inadequate volume of blood received in culture bottles   Culture   Final    NO GROWTH 5 DAYS Performed at [facility], [address], [place], [place] [address]    Report Status 04/15/2018 FINAL  Final  Culture, respiratory (NON-Expectorated)     Status: None   Collection Time: 04/10/18  3:15 PM  Result Value Ref Range Status   Specimen Description TRACHEAL ASPIRATE  Final   Special Requests NONE  Final   Gram Stain   Final    ABUNDANT WBC PRESENT,BOTH PMN AND MONONUCLEAR FEW GRAM POSITIVE COCCI MODERATE GRAM VARIABLE ROD    Culture   Final    Consistent with normal respiratory flora. Performed at [facility], [address] [place], [place] [address]    Report Status 04/12/2018 FINAL  Final  Culture, Urine     Status: Abnormal   Collection Time: 04/10/18  4:15 PM  Result  Value Ref  Range Status   Specimen Description URINE, RANDOM  Final   Special Requests   Final    NONE Performed at [facility], [address], [place], [place] [address]    Culture MULTIPLE SPECIES PRESENT, SUGGEST RECOLLECTION (A)  Final   Report Status 04/11/2018 FINAL  Final  Culture, Urine     Status: None   Collection Time: 04/13/18  6:00 PM  Result Value Ref Range Status   Specimen Description URINE, RANDOM  Final   Special Requests NONE  Final   Culture   Final    NO GROWTH Performed at [facility], [address], [place], [place] [address]    Report Status 04/15/2018 FINAL  Final    Coagulation Studies: Recent Labs    04/14/18 0700 04/15/18 0716 04/16/18 0638  LABPROT 17.5* 17.8* 19.3*  INR 1.45 1.48 1.64    Urinalysis: Recent Labs    04/13/18 1800  COLORURINE YELLOW  LABSPEC 1.016  PHURINE 5.0  GLUCOSEU NEGATIVE  HGBUR LARGE*  BILIRUBINUR NEGATIVE  KETONESUR NEGATIVE  PROTEINUR 30*  NITRITE NEGATIVE  LEUKOCYTESUR TRACE*      Imaging: No results found.   Medications:       Assessment/ Plan:  [name]  with a PMHx of diabetes mellitus type 2, osteoarthritis, who was admitted to [facility] on 04/06/2018 for ongoing treatment CVA with basilar occlusion on March 10, 2018 status post basilar artery angioplasty on March 10, 2018.  Patient initially presented outside hospital with facial droop as well as dysarthria.  During prior hospitalization he also developed acute respiratory failure and is status post tracheostomy placement as well as PEG tube placement.  In addition he had worsening renal function during the course of the hospitalization and temporary left internal jugular dialysis catheter was placed and he was initiated on dialysis immediately before transfer here.   1.  Acute renal failure secondary to ATN. 2.  Acute respiratory failure. 3.  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 4.  Anemia unspecified. 5.   MRSA sepsis secondary to dialysis catheter.  Plan: The patient's dialysis catheter was removed last week.  Temperature currently 99.5.  Labs for today to be drawn.  He has good urine output at the moment at 1.4 L over the preceding 24 hours.  Therefore we will hold off on additional dialysis at the moment.  Treatment of infection as per hospitalist.  Patient continues to have guarded neurologic prognosis.   lOS: 0 Lyam Provencio 5/20/20198:38 AM

## 2018-04-16 NOTE — Progress Notes (Signed)
  Echocardiogram 2D Echocardiogram has been performed.  Delcie Roch 04/16/2018, 2:59 PM

## 2018-04-17 DIAGNOSIS — I482 Chronic atrial fibrillation: Secondary | ICD-10-CM | POA: Diagnosis not present

## 2018-04-17 DIAGNOSIS — J9621 Acute and chronic respiratory failure with hypoxia: Secondary | ICD-10-CM | POA: Diagnosis not present

## 2018-04-17 DIAGNOSIS — J690 Pneumonitis due to inhalation of food and vomit: Secondary | ICD-10-CM | POA: Diagnosis not present

## 2018-04-17 DIAGNOSIS — N170 Acute kidney failure with tubular necrosis: Secondary | ICD-10-CM | POA: Diagnosis not present

## 2018-04-17 LAB — BASIC METABOLIC PANEL
ANION GAP: 9 (ref 5–15)
BUN: 81 mg/dL — ABNORMAL HIGH (ref 6–20)
CALCIUM: 8.2 mg/dL — AB (ref 8.9–10.3)
CO2: 24 mmol/L (ref 22–32)
Chloride: 116 mmol/L — ABNORMAL HIGH (ref 101–111)
Creatinine, Ser: 2.55 mg/dL — ABNORMAL HIGH (ref 0.61–1.24)
GFR, EST AFRICAN AMERICAN: 28 mL/min — AB (ref >60)
GFR, EST NON AFRICAN AMERICAN: 24 mL/min — AB (ref >60)
GLUCOSE: 162 mg/dL — AB (ref 65–99)
Potassium: 3.5 mmol/L (ref 3.5–5.1)
SODIUM: 149 mmol/L — AB (ref 135–145)

## 2018-04-17 LAB — PROTIME-INR
INR: 2.16
PROTHROMBIN TIME: 23.9 s — AB (ref 11.4–15.2)

## 2018-04-17 NOTE — Progress Notes (Signed)
Pulmonary Critical Care Medicine [facility] [place]   PULMONARY SERVICE  PROGRESS NOTE  Date of Service: 04/17/2018  CLARKE PERETZ  ZOX:[id number]  DOB: Apr 01, 1946   DOA: 04/09/2018  Referring Physician: Carron Curie, MD  HPI: Curtis Hopkins is a 72 y.o. male seen for follow up of Acute on Chronic Respiratory Failure.  Patient had echocardiogram done results awaited.  Right now is on T collar doing fairly well.  Medications: Reviewed on Rounds  Physical Exam:  Vitals: Temperature 99.4 pulse 94 respiratory 24 blood pressure 137/65 saturations 95%  Ventilator Settings off the ventilator on T collar with copious secretions  . General: Comfortable at this time . Eyes: Grossly normal lids, irises & conjunctiva . ENT: grossly tongue is normal . Neck: no obvious mass . Cardiovascular: S1 S2 normal no gallop . Respiratory: No rhonchi coarse breath sounds . Abdomen: soft . Skin: no rash seen on limited exam . Musculoskeletal: not rigid . Psychiatric:unable to assess . Neurologic: no seizure no involuntary movements         Labs on Admission:  Basic Metabolic Panel: Recent Labs  Lab 04/10/18 2020 04/12/18 0711 04/13/18 0706 04/16/18 0638 04/16/18 0904 04/17/18 0634  NA 148* 143 145 151* 151* 149*  K 3.8 3.3* 3.2* 3.7 3.5 3.5  CL 111 105 109 116* 114* 116*  CO2 GLUCOSE 114* 140* 145* 133* 149* 162*  BUN 60* 57* 67* 78* 79* 81*  CREATININE 2.18* 2.76* 2.98* 2.34* 2.32* 2.55*  CALCIUM 8.2* 8.1* 8.2* 8.2* 8.3* 8.2*  MG  --  2.1  --  2.3 2.2  --   PHOS 3.1 3.6 4.1 4.3 4.2  --     Liver Function Tests: Recent Labs  Lab 04/10/18 2020 04/12/18 0711 04/13/18 0706 04/16/18 0638 04/16/18 0904  ALBUMIN 2.0* 1.9* 1.9* 2.0* 2.0*   No results for input(s): LIPASE, AMYLASE in the last 168 hours. No results for input(s): AMMONIA in the last 168 hours.  CBC: Recent Labs  Lab 04/10/18 2020 04/12/18 0711 04/13/18 0706  04/16/18 0638 04/16/18 0904  WBC 6.7 10.5 10.2 10.7* 11.1*  HGB 9.5* 8.8* 9.0* 9.3* 9.3*  HCT 31.5* 28.6* 29.1* 31.1* 30.5*  MCV 94.0 92.6 92.4 95.4 94.7  PLT 192 140* 170 178 215    Cardiac Enzymes: No results for input(s): CKTOTAL, CKMB, CKMBINDEX, TROPONINI in the last 168 hours.  BNP (last 3 results) No results for input(s): BNP in the last 8760 hours.  ProBNP (last 3 results) No results for input(s): PROBNP in the last 8760 hours.  Radiological Exams on Admission: Ir Patient Eval Tech 0-60 Mins  Result Date: 04/13/2018 Herbie Baltimore     04/13/2018  2:31 PM 04/13/18 1400.  Went to bedside to remove non-tunneled dialysis catheter approved by Dr. Oley Balm. Dressing removed after discussion with RN and confirmation of INR of 1.55. Stitch removed and catheter d/c'd.  Pressure held for 7 minutes and dressing applied.   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 Acute on chronic respiratory failure with hypoxia patient will be continued on T collar not to be decannulated because of retained copious amounts of secretions. 2. Chronic atrial fibrillation  rate is controlled we will continue to follow 3. Stroke we will continue with present therapy restorative therapy 4. Aspiration pneumonia treated with antibiotics we will monitor 5. Pulmonary embolism at baseline 6. Severe sepsis clinically resolved 7. Acute renal failure on dialysis which is on hold right now because patient's making urine   I have personally seen and evaluated the patient, evaluated laboratory and imaging results, formulated the assessment and plan and placed orders. The Patient requires high complexity decision making for  assessment and support.  Case was discussed on Rounds with the Respiratory Therapy Staff  Yevonne Pax, MD [facility] Pulmonary Critical Care Medicine Sleep Medicine

## 2018-04-18 DIAGNOSIS — I482 Chronic atrial fibrillation: Secondary | ICD-10-CM | POA: Diagnosis not present

## 2018-04-18 DIAGNOSIS — J9621 Acute and chronic respiratory failure with hypoxia: Secondary | ICD-10-CM | POA: Diagnosis not present

## 2018-04-18 DIAGNOSIS — J690 Pneumonitis due to inhalation of food and vomit: Secondary | ICD-10-CM | POA: Diagnosis not present

## 2018-04-18 DIAGNOSIS — N170 Acute kidney failure with tubular necrosis: Secondary | ICD-10-CM | POA: Diagnosis not present

## 2018-04-18 LAB — BASIC METABOLIC PANEL
ANION GAP: 11 (ref 5–15)
BUN: 86 mg/dL — ABNORMAL HIGH (ref 6–20)
CALCIUM: 8.3 mg/dL — AB (ref 8.9–10.3)
CHLORIDE: 113 mmol/L — AB (ref 101–111)
CO2: 23 mmol/L (ref 22–32)
Creatinine, Ser: 2.61 mg/dL — ABNORMAL HIGH (ref 0.61–1.24)
GFR calc non Af Amer: 23 mL/min — ABNORMAL LOW (ref >60)
GFR, EST AFRICAN AMERICAN: 27 mL/min — AB (ref >60)
GLUCOSE: 170 mg/dL — AB (ref 65–99)
POTASSIUM: 3.1 mmol/L — AB (ref 3.5–5.1)
Sodium: 147 mmol/L — ABNORMAL HIGH (ref 135–145)

## 2018-04-18 LAB — CULTURE, BLOOD (ROUTINE X 2)
SPECIAL REQUESTS: ADEQUATE
Special Requests: ADEQUATE

## 2018-04-18 LAB — VANCOMYCIN, TROUGH: VANCOMYCIN TR: 17 ug/mL (ref 15–20)

## 2018-04-18 LAB — CBC
HEMATOCRIT: 29.6 % — AB (ref 39.0–52.0)
HEMOGLOBIN: 9.1 g/dL — AB (ref 13.0–17.0)
MCH: 29.1 pg (ref 26.0–34.0)
MCHC: 30.7 g/dL (ref 30.0–36.0)
MCV: 94.6 fL (ref 78.0–100.0)
Platelets: 262 10*3/uL (ref 150–400)
RBC: 3.13 MIL/uL — ABNORMAL LOW (ref 4.22–5.81)
RDW: 16.2 % — ABNORMAL HIGH (ref 11.5–15.5)
WBC: 13.5 10*3/uL — AB (ref 4.0–10.5)

## 2018-04-18 LAB — PROTIME-INR
INR: 2.35
Prothrombin Time: 25.5 seconds — ABNORMAL HIGH (ref 11.4–15.2)

## 2018-04-18 LAB — MAGNESIUM: Magnesium: 2.5 mg/dL — ABNORMAL HIGH (ref 1.7–2.4)

## 2018-04-18 NOTE — Progress Notes (Addendum)
Pulmonary Critical Care Medicine [facility] [place]   PULMONARY SERVICE  PROGRESS NOTE  Date of Service: 04/18/2018  [name]  JYN:[id number]  DOB: [birth year]   DOA: 04/20/2018  Referring Physician: Carron Curie, MD  HPI: Curtis Hopkins is a 72 y.o. male seen for follow up of Acute on Chronic Respiratory Failure.  Currently on T collar patient's been on 20% FiO2 doing fairly well.  Patient did have a temperature noted and is being worked up for sepsis.  Patient was empirically started on antibiotics also.  Medications: Reviewed on Rounds  Physical Exam:  Vitals: Temperature 98.5 pulse 79 respiratory 15 blood pressure 142/75 saturations 99%  Ventilator Settings off the ventilator on T collar at this time  . General: Comfortable at this time . Eyes: Grossly normal lids, irises & conjunctiva . ENT: grossly tongue is normal . Neck: no obvious mass . Cardiovascular: S1 S2 normal no gallop . Respiratory: Coarse breath sounds scattered rhonchi and . Abdomen: soft . Skin: no rash seen on limited exam . Musculoskeletal: not rigid . Psychiatric:unable to assess . Neurologic: no seizure no involuntary movements         Labs on Admission:  Basic Metabolic Panel: Recent Labs  Lab 04/12/18 0711 04/13/18 0706 04/16/18 0638 04/16/18 0904 04/17/18 0634 04/18/18 0525  NA 143 145 151* 151* 149* 147*  K 3.3* 3.2* 3.7 3.5 3.5 3.1*  CL 105 109 116* 114* 116* 113*  CO2 GLUCOSE 140* 145* 133* 149* 162* 170*  BUN 57* 67* 78* 79* 81* 86*  CREATININE 2.76* 2.98* 2.34* 2.32* 2.55* 2.61*  CALCIUM 8.1* 8.2* 8.2* 8.3* 8.2* 8.3*  MG 2.1  --  2.3 2.2  --  2.5*  PHOS 3.6 4.1 4.3 4.2  --   --     Liver Function Tests: Recent Labs  Lab 04/12/18 0711 04/13/18 0706 04/16/18 0638 04/16/18 0904  ALBUMIN 1.9* 1.9* 2.0* 2.0*   No results for input(s): LIPASE, AMYLASE in the last 168 hours. No results for input(s): AMMONIA in the last 168  hours.  CBC: Recent Labs  Lab 04/12/18 0711 04/13/18 0706 04/16/18 0638 04/16/18 0904 04/18/18 0525  WBC 10.5 10.2 10.7* 11.1* 13.5*  HGB 8.8* 9.0* 9.3* 9.3* 9.1*  HCT 28.6* 29.1* 31.1* 30.5* 29.6*  MCV 92.6 92.4 95.4 94.7 94.6  PLT 140* 170 178 215 262    Cardiac Enzymes: No results for input(s): CKTOTAL, CKMB, CKMBINDEX, TROPONINI in the last 168 hours.  BNP (last 3 results) No results for input(s): BNP in the last 8760 hours.  ProBNP (last 3 results) No results for input(s): PROBNP in the last 8760 hours.  Radiological Exams on Admission: No results found.  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 Acute on chronic respiratory failure with hypoxia patient is remaining on T collar which will be continued.  Titrate oxygen continue pulmonary toilet supportive care 2. Severe sepsis with shock hemodynamically remained stable but now has a fever patient is being worked up for sepsis and started on empiric antibiotics as already mentioned above.  We will follow-up on culture results. 3. Pulmonary embolism treated 4. Pneumonia due to aspiration treated 5. Chronic atrial  fibrillation rate is better controlled we will continue to follow 6. Acute renal failure patient is on dialysis followed by nephrology right now dialysis being held   I have personally seen and evaluated the patient, evaluated laboratory and imaging results, formulated the assessment and plan and placed orders. The Patient requires high complexity decision making for assessment and support.  Case was discussed on Rounds with the Respiratory Therapy Staff time 35 minutes  Yevonne Pax, MD [facility] Pulmonary Critical Care Medicine Sleep  Medicine

## 2018-04-18 NOTE — Progress Notes (Signed)
[facility]  ROUNDING NOTE   Subjective:  Dialysis remains on home. Creatinine up slightly to 2.6. Good urine output noted. EGFR currently 37. Serum sodium has come down to 147.  Objective:  Vital signs in last 24 hours:  Temperature 98.9 pulse 79 respirations 28 blood pressure 130/96  Physical Exam: General: No acute distress  Head: Normocephalic, atraumatic. Moist oral mucosal membranes  Eyes: Anicteric  Neck: Tracheostomy present  Lungs:  Scattered rhonchi, normal effort  Heart: S1S2 no rubs  Abdomen:  Soft, nontender, bowel sounds present, PEG present  Extremities: no peripheral edema.  Neurologic: Arousable, not following commands  Skin: No lesions  Access: Dialysis catheter removed    Basic Metabolic Panel: Recent Labs  Lab 04/12/18 0711 04/13/18 0706 04/16/18 0638 04/16/18 0904 04/17/18 0634 04/18/18 0525  NA 143 145 151* 151* 149* 147*  K 3.3* 3.2* 3.7 3.5 3.5 3.1*  CL 105 109 116* 114* 116* 113*  CO2 _0 GLUCOSE 140* 145* 133* 149* 162* 170*  BUN 57* 67* 78* 79* 81* 86*  CREATININE 2.76* 2.98* 2.34* 2.32* 2.55* 2.61*  CALCIUM 8.1* 8.2* 8.2* 8.3* 8.2* 8.3*  MG 2.1  --  2.3 2.2  --  2.5*  PHOS 3.6 4.1 4.3 4.2  --   --     Liver Function Tests: Recent Labs  Lab 04/12/18 0711 04/13/18 0706 04/16/18 0638 04/16/18 0904  ALBUMIN 1.9* 1.9* 2.0* 2.0*   No results for input(s): LIPASE, AMYLASE in the last 168 hours. No results for input(s): AMMONIA in the last 168 hours.  CBC: Recent Labs  Lab 04/12/18 0711 04/13/18 0706 04/16/18 0638 04/16/18 0904 04/18/18 0525  WBC 10.5 10.2 10.7* 11.1* 13.5*  HGB 8.8* 9.0* 9.3* 9.3* 9.1*  HCT 28.6* 29.1* 31.1* 30.5* 29.6*  MCV 92.6 92.4 95.4 94.7 94.6  PLT 140* 170 178 215 262    Cardiac Enzymes: No results for input(s): CKTOTAL, CKMB, CKMBINDEX, TROPONINI in the last 168 hours.  BNP: Invalid input(s): POCBNP  CBG: No results for input(s): GLUCAP in the last 168  hours.  Microbiology: Results for orders placed or performed during the hospital encounter of 03/31/2018  C difficile quick scan w PCR reflex     Status: None   Collection Time: 04/03/2018  3:01 PM  Result Value Ref Range Status   C Diff antigen NEGATIVE NEGATIVE Final   C Diff toxin NEGATIVE NEGATIVE Final   C Diff interpretation No C. difficile detected.  Final    Comment: Performed at [facility], [place] [address], [place], [place] [address]  Culture, Urine     Status: None   Collection Time: 03/29/18 10:45 PM  Result Value Ref Range Status   Specimen Description URINE, RANDOM  Final   Special Requests NONE  Final   Culture   Final    NO GROWTH Performed at [facility], [place] [address], [place], [place] [address]    Report Status 03/31/2018 FINAL  Final  Culture, respiratory (NON-Expectorated)     Status: None   Collection Time: 03/30/18  8:55 AM  Result Value Ref Range Status   Specimen Description TRACHEAL ASPIRATE  Final   Special Requests NONE  Final   Gram Stain   Final    MODERATE WBC PRESENT, PREDOMINANTLY PMN MODERATE SQUAMOUS EPITHELIAL CELLS PRESENT FEW GRAM POSITIVE COCCI FEW GRAM POSITIVE RODS    Culture   Final    Consistent with normal respiratory flora. Performed at  [facility], [address] [place], [place] [address]    Report Status 04/01/2018 FINAL  Final  Culture, respiratory (NON-Expectorated)     Status: None   Collection Time: 04/05/18  4:14 PM  Result Value Ref Range Status   Specimen Description TRACHEAL ASPIRATE  Final   Special Requests NONE  Final   Gram Stain   Final    ABUNDANT WBC PRESENT, PREDOMINANTLY PMN MODERATE GRAM POSITIVE COCCI MODERATE GRAM VARIABLE ROD    Culture   Final    MODERATE Consistent with normal respiratory flora. Performed at [facility], [address] [place], [place] [address]    Report Status 04/07/2018 FINAL  Final  Culture, Urine     Status: None   Collection Time: 04/05/18  4:45  PM  Result Value Ref Range Status   Specimen Description URINE, RANDOM  Final   Special Requests NONE  Final   Culture   Final    NO GROWTH Performed at [facility], [address] [place], [place] [address]    Report Status 04/06/2018 FINAL  Final  Culture, blood (routine x 2)     Status: None   Collection Time: 04/06/18  3:19 PM  Result Value Ref Range Status   Specimen Description BLOOD RIGHT HAND  Final   Special Requests   Final    BOTTLES DRAWN AEROBIC ONLY Blood Culture adequate volume   Culture   Final    NO GROWTH 5 DAYS Performed at [facility], [address] [place], [place] [address]    Report Status 04/11/2018 FINAL  Final  Culture, blood (routine x 2)     Status: Abnormal   Collection Time: 04/06/18  3:28 PM  Result Value Ref Range Status   Specimen Description BLOOD LEFT HAND  Final   Special Requests   Final    BOTTLES DRAWN AEROBIC AND ANAEROBIC Blood Culture adequate volume   Culture  Setup Time   Final    GRAM POSITIVE COCCI IN CLUSTERS IN BOTH AEROBIC AND ANAEROBIC BOTTLES GRAM POSITIVE RODS AEROBIC BOTTLE ONLY CRITICAL RESULT CALLED TO, READ BACK BY AND VERIFIED WITH: N. OLUBODUN,RN 0706 04/07/2018 T. TYSOR    Culture (A)  Final    BACILLUS SPECIES Standardized susceptibility testing for this organism is not available. STAPHYLOCOCCUS SPECIES (COAGULASE NEGATIVE) SUSCEPTIBILITIES PERFORMED ON PREVIOUS CULTURE WITHIN THE LAST 5 DAYS. Performed at [facility], [address] [place], [place] [address]    Report Status 04/14/2018 FINAL  Final  Blood Culture ID Panel (Reflexed)     Status: Abnormal   Collection Time: 04/06/18  3:28 PM  Result Value Ref Range Status   Enterococcus species NOT DETECTED NOT DETECTED Final   Listeria monocytogenes NOT DETECTED NOT DETECTED Final   Staphylococcus species DETECTED (A) NOT DETECTED Final    Comment: Methicillin (oxacillin) resistant coagulase negative staphylococcus. Possible blood  culture contaminant (unless isolated from more than one blood culture draw or clinical case suggests pathogenicity). No antibiotic treatment is indicated for blood  culture contaminants. CRITICAL RESULT CALLED TO, READ BACK BY AND VERIFIED WITH: N. OLUBODUN,RN 0708 04/07/2018 T. TYSOR    Staphylococcus aureus NOT DETECTED NOT DETECTED Final   Methicillin resistance DETECTED (A) NOT DETECTED Final    Comment: CRITICAL RESULT CALLED TO, READ BACK BY AND VERIFIED WITH: N. OLUBODUN,RN 0708 04/07/2018 T. TYSOR    Streptococcus species NOT DETECTED NOT DETECTED Final   Streptococcus agalactiae NOT DETECTED NOT   DETECTED Final   Streptococcus pneumoniae NOT DETECTED NOT DETECTED Final   Streptococcus pyogenes NOT DETECTED NOT DETECTED Final   Acinetobacter baumannii NOT DETECTED NOT DETECTED Final   Enterobacteriaceae species NOT DETECTED NOT DETECTED Final   Enterobacter cloacae complex NOT DETECTED NOT DETECTED Final   Escherichia coli NOT DETECTED NOT DETECTED Final   Klebsiella oxytoca NOT DETECTED NOT DETECTED Final   Klebsiella pneumoniae NOT DETECTED NOT DETECTED Final   Proteus species NOT DETECTED NOT DETECTED Final   Serratia marcescens NOT DETECTED NOT DETECTED Final   Haemophilus influenzae NOT DETECTED NOT DETECTED Final   Neisseria meningitidis NOT DETECTED NOT DETECTED Final   Pseudomonas aeruginosa NOT DETECTED NOT DETECTED Final   Candida albicans NOT DETECTED NOT DETECTED Final   Candida glabrata NOT DETECTED NOT DETECTED Final   Candida krusei NOT DETECTED NOT DETECTED Final   Candida parapsilosis NOT DETECTED NOT DETECTED Final   Candida tropicalis NOT DETECTED NOT DETECTED Final  Culture, blood (routine x 2)     Status: Abnormal   Collection Time: 04/10/18 10:35 AM  Result Value Ref Range Status   Specimen Description BLOOD RIGHT HAND  Final   Special Requests   Final    BOTTLES DRAWN AEROBIC ONLY Blood Culture results may not be optimal due to an inadequate volume of  blood received in culture bottles   Culture  Setup Time   Final    AEROBIC BOTTLE ONLY GRAM POSITIVE COCCI CRITICAL VALUE NOTED.  VALUE IS CONSISTENT WITH PREVIOUSLY REPORTED AND CALLED VALUE. Performed at [facility], [place] [address], [place], [place] [address]    Culture STAPHYLOCOCCUS SPECIES (COAGULASE NEGATIVE) (A)  Final   Report Status 04/14/2018 FINAL  Final   Organism ID, Bacteria STAPHYLOCOCCUS SPECIES (COAGULASE NEGATIVE)  Final      Susceptibility   Staphylococcus species (coagulase negative) - MIC*    CIPROFLOXACIN >=8 RESISTANT Resistant     ERYTHROMYCIN >=8 RESISTANT Resistant     GENTAMICIN 8 INTERMEDIATE Intermediate     OXACILLIN >=4 RESISTANT Resistant     TETRACYCLINE 2 SENSITIVE Sensitive     VANCOMYCIN 2 SENSITIVE Sensitive     TRIMETH/SULFA 80 RESISTANT Resistant     CLINDAMYCIN >=8 RESISTANT Resistant     RIFAMPIN <=0.5 SENSITIVE Sensitive     Inducible Clindamycin NEGATIVE Sensitive     * STAPHYLOCOCCUS SPECIES (COAGULASE NEGATIVE)  Culture, blood (routine x 2)     Status: None   Collection Time: 04/10/18 10:42 AM  Result Value Ref Range Status   Specimen Description BLOOD RIGHT ARM  Final   Special Requests   Final    BOTTLES DRAWN AEROBIC ONLY Blood Culture results may not be optimal due to an inadequate volume of blood received in culture bottles   Culture   Final    NO GROWTH 5 DAYS Performed at [facility], [address] [place], [place] [address]    Report Status 04/15/2018 FINAL  Final  Culture, respiratory (NON-Expectorated)     Status: None   Collection Time: 04/10/18  3:15 PM  Result Value Ref Range Status   Specimen Description TRACHEAL ASPIRATE  Final   Special Requests NONE  Final   Gram Stain   Final    ABUNDANT WBC PRESENT,BOTH PMN AND MONONUCLEAR FEW GRAM POSITIVE COCCI MODERATE GRAM VARIABLE ROD    Culture   Final    Consistent with normal respiratory flora. Performed at [facility], [place] [address],  [place], [place]  [address]    Report Status 04/12/2018 FINAL  Final  Culture, Urine     Status: Abnormal   Collection Time: 04/10/18  4:15 PM  Result Value Ref Range Status   Specimen Description URINE, RANDOM  Final   Special Requests   Final    NONE Performed at [facility], [place] [address] [place], [place] [address]    Culture MULTIPLE SPECIES PRESENT, SUGGEST RECOLLECTION (A)  Final   Report Status 04/11/2018 FINAL  Final  Culture, Urine     Status: None   Collection Time: 04/13/18  6:00 PM  Result Value Ref Range Status   Specimen Description URINE, RANDOM  Final   Special Requests NONE  Final   Culture   Final    NO GROWTH Performed at [facility], [place] [address] [place], [place] [address]    Report Status 04/15/2018 FINAL  Final  Culture, blood (routine x 2)     Status: Abnormal   Collection Time: 04/15/18  2:15 PM  Result Value Ref Range Status   Specimen Description BLOOD LEFT HAND  Final   Special Requests   Final    BOTTLES DRAWN AEROBIC AND ANAEROBIC Blood Culture adequate volume   Culture  Setup Time   Final    GRAM POSITIVE COCCI ANAEROBIC BOTTLE ONLY CRITICAL VALUE NOTED.  VALUE IS CONSISTENT WITH PREVIOUSLY REPORTED AND CALLED VALUE.    Culture (A)  Final    STAPHYLOCOCCUS SPECIES (COAGULASE NEGATIVE) SUSCEPTIBILITIES PERFORMED ON PREVIOUS CULTURE WITHIN THE LAST 5 DAYS. Performed at [facility], [place] [address], [place], [place] [address]    Report Status 04/18/2018 FINAL  Final  Culture, blood (routine x 2)     Status: Abnormal   Collection Time: 04/15/18  2:25 PM  Result Value Ref Range Status   Specimen Description BLOOD RIGHT HAND  Final   Special Requests   Final    BOTTLES DRAWN AEROBIC AND ANAEROBIC Blood Culture adequate volume   Culture  Setup Time   Final    GRAM POSITIVE COCCI ANAEROBIC BOTTLE ONLY CRITICAL VALUE NOTED.  VALUE IS CONSISTENT WITH PREVIOUSLY REPORTED AND CALLED VALUE.    Culture (A)  Final    STAPHYLOCOCCUS  SPECIES (COAGULASE NEGATIVE) SUSCEPTIBILITIES PERFORMED ON PREVIOUS CULTURE WITHIN THE LAST 5 DAYS. Performed at [facility], [place] [address], [place], [place] [address]    Report Status 04/18/2018 FINAL  Final  Culture, respiratory (NON-Expectorated)     Status: None (Preliminary result)   Collection Time: 04/17/18  1:45 PM  Result Value Ref Range Status   Specimen Description TRACHEAL ASPIRATE  Final   Special Requests NONE  Final   Gram Stain   Final    FEW WBC PRESENT,BOTH PMN AND MONONUCLEAR FEW GRAM POSITIVE COCCI ABUNDANT GRAM VARIABLE ROD FEW YEAST MODERATE SQUAMOUS EPITHELIAL CELLS PRESENT    Culture   Final    CULTURE REINCUBATED FOR BETTER GROWTH Performed at [facility], [place] [address] [place], [place] [address]    Report Status PENDING  Incomplete    Coagulation Studies: Recent Labs    04/16/18 0638 04/17/18 0634 04/18/18 0525  LABPROT 19.3* 23.9* 25.5*  INR 1.64 2.16 2.35    Urinalysis: No results for input(s): COLORURINE, LABSPEC, PHURINE, GLUCOSEU, HGBUR, BILIRUBINUR, KETONESUR, PROTEINUR, UROBILINOGEN, NITRITE, LEUKOCYTESUR in the last 72 hours.  Invalid input(s): APPERANCEUR    Imaging: No results found.   Medications:       Assessment/ Plan:  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  Patient initially presented outside hospital with facial droop as well as dysarthria.  During prior hospitalization he also developed acute respiratory failure and is status post tracheostomy placement as well as PEG tube placement.  In addition he had worsening renal function during the course of the hospitalization and temporary left internal jugular dialysis catheter was placed and he was initiated on dialysis immediately before  transfer here.   1.  Acute renal failure secondary to ATN. 2.  Acute respiratory failure. 3.  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 4.  Anemia unspecified. 5.  MRSA sepsis secondary to dialysis catheter. 6.  Hypernatremia.  Plan: The patient's creatinine is trended up slightly to 2.6 with a BUN of 86.  However he still has good urine output therefore no urgent indication to restart dialysis at this time.  The patient continues to be breathing comfortably via his tracheostomy.  No significant change in terms of his mental status.  This is likely secondary to underlying CVA that occurred at the outside hospitalization.  Is also noted is having hyponatremia.  He is currently receiving free water.  Continue to monitor serum sodium daily.  lOS: 0 Avenir Lozinski 5/22/20194:02 PM

## 2018-04-19 DIAGNOSIS — N170 Acute kidney failure with tubular necrosis: Secondary | ICD-10-CM | POA: Diagnosis not present

## 2018-04-19 DIAGNOSIS — J690 Pneumonitis due to inhalation of food and vomit: Secondary | ICD-10-CM | POA: Diagnosis not present

## 2018-04-19 DIAGNOSIS — J9621 Acute and chronic respiratory failure with hypoxia: Secondary | ICD-10-CM | POA: Diagnosis not present

## 2018-04-19 DIAGNOSIS — I482 Chronic atrial fibrillation: Secondary | ICD-10-CM | POA: Diagnosis not present

## 2018-04-19 LAB — BASIC METABOLIC PANEL
Anion gap: 10 (ref 5–15)
BUN: 93 mg/dL — AB (ref 6–20)
CHLORIDE: 112 mmol/L — AB (ref 101–111)
CO2: 22 mmol/L (ref 22–32)
Calcium: 8 mg/dL — ABNORMAL LOW (ref 8.9–10.3)
Creatinine, Ser: 2.83 mg/dL — ABNORMAL HIGH (ref 0.61–1.24)
GFR calc Af Amer: 24 mL/min — ABNORMAL LOW (ref >60)
GFR calc non Af Amer: 21 mL/min — ABNORMAL LOW (ref >60)
GLUCOSE: 164 mg/dL — AB (ref 65–99)
Potassium: 3 mmol/L — ABNORMAL LOW (ref 3.5–5.1)
SODIUM: 144 mmol/L (ref 135–145)

## 2018-04-19 LAB — PROTIME-INR
INR: 2.72
Prothrombin Time: 28.6 seconds — ABNORMAL HIGH (ref 11.4–15.2)

## 2018-04-19 NOTE — Progress Notes (Signed)
Pulmonary Critical Care Medicine [facility] [place]   PULMONARY SERVICE  PROGRESS NOTE  Date of Service: 04/19/2018  AVERIE MEINER  XLK:[id number]  DOB: [birth year]   DOA: 04/04/2018  Referring Physician: Carron Curie, MD  HPI: GEARALD STONEBRAKER is a 72 y.o. male seen for follow up of Acute on Chronic Respiratory Failure.  Patient is on T collar has been doing well.  Patient's been on 20% oxygen with excellent saturations.  Medications: Reviewed on Rounds  Physical Exam:  Vitals: Temperature 98.6 pulse 85 respiratory rate 23 blood pressure 150/64 saturations 96%  Ventilator Settings currently is off the ventilator on T collar 28% FiO2  . General: Comfortable at this time . Eyes: Grossly normal lids, irises & conjunctiva . ENT: grossly tongue is normal . Neck: no obvious mass . Cardiovascular: S1 S2 normal no gallop . Respiratory: No rhonchi are noted at this time . Abdomen: soft . Skin: no rash seen on limited exam . Musculoskeletal: not rigid . Psychiatric:unable to assess . Neurologic: no seizure no involuntary movements         Labs on Admission:  Basic Metabolic Panel: Recent Labs  Lab 04/13/18 0706 04/16/18 3664 04/16/18 0904 04/17/18 0634 04/18/18 0525 04/19/18 0539  NA 145 151* 151* 149* 147* 144  K 3.2* 3.7 3.5 3.5 3.1* 3.0*  CL 109 116* 114* 116* 113* 112*  CO2 GLUCOSE 145* 133* 149* 162* 170* 164*  BUN 67* 78* 79* 81* 86* 93*  CREATININE 2.98* 2.34* 2.32* 2.55* 2.61* 2.83*  CALCIUM 8.2* 8.2* 8.3* 8.2* 8.3* 8.0*  MG  --  2.3 2.2  --  2.5*  --   PHOS 4.1 4.3 4.2  --   --   --     Liver Function Tests: Recent Labs  Lab 04/13/18 0706 04/16/18 0638 04/16/18 0904  ALBUMIN 1.9* 2.0* 2.0*   No results for input(s): LIPASE, AMYLASE in the last 168 hours. No results for input(s): AMMONIA in the last 168 hours.  CBC: Recent Labs  Lab 04/13/18 0706 04/16/18 0638 04/16/18 0904 04/18/18 0525  WBC 10.2 10.7*  11.1* 13.5*  HGB 9.0* 9.3* 9.3* 9.1*  HCT 29.1* 31.1* 30.5* 29.6*  MCV 92.4 95.4 94.7 94.6  PLT 170 178 215 262    Cardiac Enzymes: No results for input(s): CKTOTAL, CKMB, CKMBINDEX, TROPONINI in the last 168 hours.  BNP (last 3 results) No results for input(s): BNP in the last 8760 hours.  ProBNP (last 3 results) No results for input(s): PROBNP in the last 8760 hours.  Radiological Exams on Admission: No results found.  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 Acute on chronic respiratory failure with hypoxia we will continue with supportive care on T collar patient is at baseline.  Not able to handle any secretions at this time. 2. Chronic atrial fibrillation rate is controlled we will continue to follow 3. Stroke restorative therapy 4. Pneumonia due to aspiration stable at this time 5. Pulmonary embolism we will continue present management 6. Severe sepsis resolved 7. Acute renal failure followed by nephrology for dialysis sepsis   I have personally seen and evaluated the patient, evaluated laboratory and imaging results, formulated  the assessment and plan and placed orders. The Patient requires high complexity decision making for assessment and support.  Case was discussed on Rounds with the Respiratory Therapy Staff  Allyne Gee, MD [facility] Pulmonary Critical Care Medicine Sleep Medicine

## 2018-04-20 DIAGNOSIS — N170 Acute kidney failure with tubular necrosis: Secondary | ICD-10-CM | POA: Diagnosis not present

## 2018-04-20 DIAGNOSIS — J690 Pneumonitis due to inhalation of food and vomit: Secondary | ICD-10-CM | POA: Diagnosis not present

## 2018-04-20 DIAGNOSIS — I482 Chronic atrial fibrillation: Secondary | ICD-10-CM | POA: Diagnosis not present

## 2018-04-20 DIAGNOSIS — J9621 Acute and chronic respiratory failure with hypoxia: Secondary | ICD-10-CM | POA: Diagnosis not present

## 2018-04-20 LAB — CULTURE, RESPIRATORY W GRAM STAIN: Culture: NORMAL

## 2018-04-20 LAB — BASIC METABOLIC PANEL
Anion gap: 11 (ref 5–15)
BUN: 99 mg/dL — ABNORMAL HIGH (ref 6–20)
CALCIUM: 8 mg/dL — AB (ref 8.9–10.3)
CO2: 22 mmol/L (ref 22–32)
Chloride: 108 mmol/L (ref 101–111)
Creatinine, Ser: 3.02 mg/dL — ABNORMAL HIGH (ref 0.61–1.24)
GFR, EST AFRICAN AMERICAN: 23 mL/min — AB (ref >60)
GFR, EST NON AFRICAN AMERICAN: 19 mL/min — AB (ref >60)
Glucose, Bld: 163 mg/dL — ABNORMAL HIGH (ref 65–99)
Potassium: 3.3 mmol/L — ABNORMAL LOW (ref 3.5–5.1)
Sodium: 141 mmol/L (ref 135–145)

## 2018-04-20 LAB — CULTURE, RESPIRATORY

## 2018-04-20 LAB — PROTIME-INR
INR: 3.55
PROTHROMBIN TIME: 35.2 s — AB (ref 11.4–15.2)

## 2018-04-20 NOTE — Progress Notes (Signed)
[name]  ROUNDING NOTE   Subjective:  Patient seen at bedside. He will arouse to voice contact with eyes but not following any other commands.  Objective:  Vital signs in last 24 hours:  Temperature 97.6 pulse 88 respirations 21 blood pressure 151/78  Physical Exam: General: No acute distress  Head: Normocephalic, atraumatic. Moist oral mucosal membranes  Eyes: Anicteric  Neck: Tracheostomy present  Lungs:  Scattered rhonchi, normal effort  Heart: S1S2 no rubs  Abdomen:  Soft, nontender, bowel sounds present, PEG present  Extremities: no peripheral edema.  Neurologic: Arousable, will track with eyes  Skin: No lesions  Access: Dialysis catheter removed    Basic Metabolic Panel: Recent Labs  Lab 04/16/18 0638 04/16/18 0904 04/17/18 0634 04/18/18 0525 04/19/18 0539 04/20/18 0645  NA 151* 151* 149* 147* 144 141  K 3.7 3.5 3.5 3.1* 3.0* 3.3*  CL 116* 114* 116* 113* 112* 108  CO2 GLUCOSE 133* 149* 162* 170* 164* 163*  BUN 78* 79* 81* 86* 93* 99*  CREATININE 2.34* 2.32* 2.55* 2.61* 2.83* 3.02*  CALCIUM 8.2* 8.3* 8.2* 8.3* 8.0* 8.0*  MG 2.3 2.2  --  2.5*  --   --   PHOS 4.3 4.2  --   --   --   --     Liver Function Tests: Recent Labs  Lab 04/16/18 0638 04/16/18 0904  ALBUMIN 2.0* 2.0*   No results for input(s): LIPASE, AMYLASE in the last 168 hours. No results for input(s): AMMONIA in the last 168 hours.  CBC: Recent Labs  Lab 04/16/18 0638 04/16/18 0904 04/18/18 0525  WBC 10.7* 11.1* 13.5*  HGB 9.3* 9.3* 9.1*  HCT 31.1* 30.5* 29.6*  MCV 95.4 94.7 94.6  PLT 178 215 262    Cardiac Enzymes: No results for input(s): CKTOTAL, CKMB, CKMBINDEX, TROPONINI in the last 168 hours.  BNP: Invalid input(s): POCBNP  CBG: No results for input(s): GLUCAP in the last 168 hours.  Microbiology: Results for orders placed or performed during the hospital encounter of 04/20/2018  C difficile quick scan w PCR reflex     Status: None    Collection Time: 04/19/2018  3:01 PM  Result Value Ref Range Status   C Diff antigen NEGATIVE NEGATIVE Final   C Diff toxin NEGATIVE NEGATIVE Final   C Diff interpretation No C. difficile detected.  Final    Comment: Performed at [facility], [address] [place], [place] [address]  Culture, Urine     Status: None   Collection Time: 03/29/18 10:45 PM  Result Value Ref Range Status   Specimen Description URINE, RANDOM  Final   Special Requests NONE  Final   Culture   Final    NO GROWTH Performed at [facility], [address] [place], [place] [address]    Report Status 03/31/2018 FINAL  Final  Culture, respiratory (NON-Expectorated)     Status: None   Collection Time: 03/30/18  8:55 AM  Result Value Ref Range Status   Specimen Description TRACHEAL ASPIRATE  Final   Special Requests NONE  Final   Gram Stain   Final    MODERATE WBC PRESENT, PREDOMINANTLY PMN MODERATE SQUAMOUS EPITHELIAL CELLS PRESENT FEW GRAM POSITIVE COCCI FEW GRAM POSITIVE RODS    Culture   Final    Consistent with normal respiratory flora. Performed at [facility], [address] [place], [place] [address]    Report Status 04/01/2018 FINAL  Final  Culture, respiratory (NON-Expectorated)     Status: None   Collection Time: 04/05/18  4:14 PM  Result Value Ref Range Status   Specimen Description TRACHEAL ASPIRATE  Final   Special Requests NONE  Final   Gram Stain   Final    ABUNDANT WBC PRESENT, PREDOMINANTLY PMN MODERATE GRAM POSITIVE COCCI MODERATE GRAM VARIABLE ROD    Culture   Final    MODERATE Consistent with normal respiratory flora. Performed at [facility], [address] [place], [place] [address]    Report Status 04/07/2018 FINAL  Final  Culture, Urine     Status: None   Collection Time: 04/05/18  4:45 PM  Result Value Ref Range Status   Specimen Description URINE, RANDOM  Final   Special Requests NONE  Final   Culture   Final    NO GROWTH Performed at  [facility], [address] [place], [place] [address]    Report Status 04/06/2018 FINAL  Final  Culture, blood (routine x 2)     Status: None   Collection Time: 04/06/18  3:19 PM  Result Value Ref Range Status   Specimen Description BLOOD RIGHT HAND  Final   Special Requests   Final    BOTTLES DRAWN AEROBIC ONLY Blood Culture adequate volume   Culture   Final    NO GROWTH 5 DAYS Performed at [facility], [address], [place], [place] [address]    Report Status 04/11/2018 FINAL  Final  Culture, blood (routine x 2)     Status: Abnormal   Collection Time: 04/06/18  3:28 PM  Result Value Ref Range Status   Specimen Description BLOOD LEFT HAND  Final   Special Requests   Final    BOTTLES DRAWN AEROBIC AND ANAEROBIC Blood Culture adequate volume   Culture  Setup Time   Final    GRAM POSITIVE COCCI IN CLUSTERS IN BOTH AEROBIC AND ANAEROBIC BOTTLES GRAM POSITIVE RODS AEROBIC BOTTLE ONLY CRITICAL RESULT CALLED TO, READ BACK BY AND VERIFIED WITH: Jannett Celestine [id number] 04/07/2018 T. TYSOR    Culture (A)  Final    BACILLUS SPECIES Standardized susceptibility testing for this organism is not available. STAPHYLOCOCCUS SPECIES (COAGULASE NEGATIVE) SUSCEPTIBILITIES PERFORMED ON PREVIOUS CULTURE WITHIN THE LAST 5 DAYS. Performed at [facility], [address] [place], [place] [address]    Report Status 04/14/2018 FINAL  Final  Blood Culture ID Panel (Reflexed)     Status: Abnormal   Collection Time: 04/06/18  3:28 PM  Result Value Ref Range Status   Enterococcus species NOT DETECTED NOT DETECTED Final   Listeria monocytogenes NOT DETECTED NOT DETECTED Final   Staphylococcus species DETECTED (A) NOT DETECTED Final    Comment: Methicillin (oxacillin) resistant coagulase negative staphylococcus. Possible blood culture contaminant (unless isolated from more than one blood culture draw or clinical case suggests pathogenicity). No antibiotic treatment is indicated for blood   culture contaminants. CRITICAL RESULT CALLED TO, READ BACK BY AND VERIFIED WITH: N. OLUBODUN,RN [id number] 04/07/2018 T. TYSOR    Staphylococcus aureus NOT DETECTED NOT DETECTED Final   Methicillin resistance DETECTED (A) NOT DETECTED Final    Comment: CRITICAL RESULT CALLED TO, READ BACK BY AND VERIFIED WITH: N. OLUBODUN,RN [id number] 04/07/2018 T. TYSOR    Streptococcus species NOT DETECTED NOT DETECTED Final   Streptococcus agalactiae NOT DETECTED NOT DETECTED Final   Streptococcus pneumoniae NOT DETECTED NOT DETECTED Final   Streptococcus pyogenes NOT DETECTED NOT DETECTED  Final   Acinetobacter baumannii NOT DETECTED NOT DETECTED Final   Enterobacteriaceae species NOT DETECTED NOT DETECTED Final   Enterobacter cloacae complex NOT DETECTED NOT DETECTED Final   Escherichia coli NOT DETECTED NOT DETECTED Final   Klebsiella oxytoca NOT DETECTED NOT DETECTED Final   Klebsiella pneumoniae NOT DETECTED NOT DETECTED Final   Proteus species NOT DETECTED NOT DETECTED Final   Serratia marcescens NOT DETECTED NOT DETECTED Final   Haemophilus influenzae NOT DETECTED NOT DETECTED Final   Neisseria meningitidis NOT DETECTED NOT DETECTED Final   Pseudomonas aeruginosa NOT DETECTED NOT DETECTED Final   Candida albicans NOT DETECTED NOT DETECTED Final   Candida glabrata NOT DETECTED NOT DETECTED Final   Candida krusei NOT DETECTED NOT DETECTED Final   Candida parapsilosis NOT DETECTED NOT DETECTED Final   Candida tropicalis NOT DETECTED NOT DETECTED Final  Culture, blood (routine x 2)     Status: Abnormal   Collection Time: 04/10/18 10:35 AM  Result Value Ref Range Status   Specimen Description BLOOD RIGHT HAND  Final   Special Requests   Final    BOTTLES DRAWN AEROBIC ONLY Blood Culture results may not be optimal due to an inadequate volume of blood received in culture bottles   Culture  Setup Time   Final    AEROBIC BOTTLE ONLY GRAM POSITIVE COCCI CRITICAL VALUE NOTED.  VALUE IS CONSISTENT WITH  PREVIOUSLY REPORTED AND CALLED VALUE. Performed at [facility], [address], [place], [place] [address]    Culture STAPHYLOCOCCUS SPECIES (COAGULASE NEGATIVE) (A)  Final   Report Status 04/14/2018 FINAL  Final   Organism ID, Bacteria STAPHYLOCOCCUS SPECIES (COAGULASE NEGATIVE)  Final      Susceptibility   Staphylococcus species (coagulase negative) - MIC*    CIPROFLOXACIN >=8 RESISTANT Resistant     ERYTHROMYCIN >=8 RESISTANT Resistant     GENTAMICIN 8 INTERMEDIATE Intermediate     OXACILLIN >=4 RESISTANT Resistant     TETRACYCLINE 2 SENSITIVE Sensitive     VANCOMYCIN 2 SENSITIVE Sensitive     TRIMETH/SULFA 80 RESISTANT Resistant     CLINDAMYCIN >=8 RESISTANT Resistant     RIFAMPIN <=0.5 SENSITIVE Sensitive     Inducible Clindamycin NEGATIVE Sensitive     * STAPHYLOCOCCUS SPECIES (COAGULASE NEGATIVE)  Culture, blood (routine x 2)     Status: None   Collection Time: 04/10/18 10:42 AM  Result Value Ref Range Status   Specimen Description BLOOD RIGHT ARM  Final   Special Requests   Final    BOTTLES DRAWN AEROBIC ONLY Blood Culture results may not be optimal due to an inadequate volume of blood received in culture bottles   Culture   Final    NO GROWTH 5 DAYS Performed at [facility], [address], [place], [place] [address]    Report Status 04/15/2018 FINAL  Final  Culture, respiratory (NON-Expectorated)     Status: None   Collection Time: 04/10/18  3:15 PM  Result Value Ref Range Status   Specimen Description TRACHEAL ASPIRATE  Final   Special Requests NONE  Final   Gram Stain   Final    ABUNDANT WBC PRESENT,BOTH PMN AND MONONUCLEAR FEW GRAM POSITIVE COCCI MODERATE GRAM VARIABLE ROD    Culture   Final    Consistent with normal respiratory flora. Performed at [facility], [address] [place], [place] [address]    Report Status 04/12/2018 FINAL  Final  Culture, Urine     Status: Abnormal  Collection Time: 04/10/18  4:15 PM  Result Value Ref  Range Status   Specimen Description URINE, RANDOM  Final   Special Requests   Final    NONE Performed at [facility], [address] [place], [place] [address]    Culture MULTIPLE SPECIES PRESENT, SUGGEST RECOLLECTION (A)  Final   Report Status 04/11/2018 FINAL  Final  Culture, Urine     Status: None   Collection Time: 04/13/18  6:00 PM  Result Value Ref Range Status   Specimen Description URINE, RANDOM  Final   Special Requests NONE  Final   Culture   Final    NO GROWTH Performed at [facility], [address] [place], [place] [address]    Report Status 04/15/2018 FINAL  Final  Culture, blood (routine x 2)     Status: Abnormal   Collection Time: 04/15/18  2:15 PM  Result Value Ref Range Status   Specimen Description BLOOD LEFT HAND  Final   Special Requests   Final    BOTTLES DRAWN AEROBIC AND ANAEROBIC Blood Culture adequate volume   Culture  Setup Time   Final    GRAM POSITIVE COCCI ANAEROBIC BOTTLE ONLY CRITICAL VALUE NOTED.  VALUE IS CONSISTENT WITH PREVIOUSLY REPORTED AND CALLED VALUE.    Culture (A)  Final    STAPHYLOCOCCUS SPECIES (COAGULASE NEGATIVE) SUSCEPTIBILITIES PERFORMED ON PREVIOUS CULTURE WITHIN THE LAST 5 DAYS. Performed at [facility], [address] [place], [place] [address]    Report Status 04/18/2018 FINAL  Final  Culture, blood (routine x 2)     Status: Abnormal   Collection Time: 04/15/18  2:25 PM  Result Value Ref Range Status   Specimen Description BLOOD RIGHT HAND  Final   Special Requests   Final    BOTTLES DRAWN AEROBIC AND ANAEROBIC Blood Culture adequate volume   Culture  Setup Time   Final    GRAM POSITIVE COCCI ANAEROBIC BOTTLE ONLY CRITICAL VALUE NOTED.  VALUE IS CONSISTENT WITH PREVIOUSLY REPORTED AND CALLED VALUE.    Culture (A)  Final    STAPHYLOCOCCUS SPECIES (COAGULASE NEGATIVE) SUSCEPTIBILITIES PERFORMED ON PREVIOUS CULTURE WITHIN THE LAST 5 DAYS. Performed at [facility], [address]  [place], [place] [address]    Report Status 04/18/2018 FINAL  Final  Culture, respiratory (NON-Expectorated)     Status: None (Preliminary result)   Collection Time: 04/17/18  1:45 PM  Result Value Ref Range Status   Specimen Description TRACHEAL ASPIRATE  Final   Special Requests NONE  Final   Gram Stain   Final    FEW WBC PRESENT,BOTH PMN AND MONONUCLEAR FEW GRAM POSITIVE COCCI ABUNDANT GRAM VARIABLE ROD FEW YEAST MODERATE SQUAMOUS EPITHELIAL CELLS PRESENT    Culture   Final    MODERATE Consistent with normal respiratory flora. Performed at [facility], [address], [place], [place] [address]    Report Status PENDING  Incomplete    Coagulation Studies: Recent Labs    04/18/18 0525 04/19/18 0539 04/20/18 0645  LABPROT 25.5* 28.6* 35.2*  INR 2.35 2.72 3.55    Urinalysis: No results for input(s): COLORURINE, LABSPEC, PHURINE, GLUCOSEU, HGBUR, BILIRUBINUR, KETONESUR, PROTEINUR, UROBILINOGEN, NITRITE, LEUKOCYTESUR in the last 72 hours.  Invalid input(s): APPERANCEUR    Imaging: No results found.   Medications:       Assessment/ Plan:  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  Patient initially presented outside hospital with facial droop as well as dysarthria.  During prior hospitalization he also developed acute respiratory failure and is status post tracheostomy placement as well as PEG tube placement.  In addition he had worsening renal function during the course of the hospitalization and temporary left internal jugular dialysis catheter was placed and he was initiated on dialysis immediately before transfer here.   1.  Acute renal failure secondary to ATN. 2.  Acute respiratory failure. 3.  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 4.   Anemia unspecified. 5.  MRSA sepsis secondary to dialysis catheter. 6.  Hypernatremia.  Plan:  The patient's BUN and creatinine are trending up.  BUN currently up to 99 with a creatinine of 3.02.  However patient is still making urine.  Potassium of 2.3 at the moment.  Hypernatremia also corrected serum sodium of 141.  If BUN and creatinine continue to rise may need to consider dialysis next week.  Otherwise for now continue supportive care.  However overall prognosis quite guarded as his neurologic recovery has been minimal.   lOS: 0 Natnael Biederman 5/24/20198:51 AM

## 2018-04-20 NOTE — Progress Notes (Signed)
Pulmonary Critical Care Medicine [facility] [place]   PULMONARY SERVICE  PROGRESS NOTE  Date of Service: 04/20/2018  Curtis Hopkins  RUE:[id number]  DOB: 03-24-1946   DOA: 04/21/18  Referring Physician: Carron Curie, MD  HPI: Curtis Hopkins is a 72 y.o. male seen for follow up of Acute on Chronic Respiratory Failure.  Off the ventilator on T collar patient secretions are fairly copious still.  He is not able to handle his secretions without a significant cough effort.  Medications: Reviewed on Rounds  Physical Exam:  Vitals: Temperature 97.6 pulse 88 respiratory rate 21 blood pressure 151/78 saturations 97%  Ventilator Settings T collar on 28% FiO2  . General: Comfortable at this time . Eyes: Grossly normal lids, irises & conjunctiva . ENT: grossly tongue is normal . Neck: no obvious mass . Cardiovascular: S1 S2 normal no gallop . Respiratory: No rhonchi expansion is equal . Abdomen: soft . Skin: no rash seen on limited exam . Musculoskeletal: not rigid . Psychiatric:unable to assess . Neurologic: no seizure no involuntary movements         Labs on Admission:  Basic Metabolic Panel: Recent Labs  Lab 04/16/18 0638 04/16/18 0904 04/17/18 0634 04/18/18 0525 04/19/18 0539 04/20/18 0645  NA 151* 151* 149* 147* 144 141  K 3.7 3.5 3.5 3.1* 3.0* 3.3*  CL 116* 114* 116* 113* 112* 108  CO2 GLUCOSE 133* 149* 162* 170* 164* 163*  BUN 78* 79* 81* 86* 93* 99*  CREATININE 2.34* 2.32* 2.55* 2.61* 2.83* 3.02*  CALCIUM 8.2* 8.3* 8.2* 8.3* 8.0* 8.0*  MG 2.3 2.2  --  2.5*  --   --   PHOS 4.3 4.2  --   --   --   --     Liver Function Tests: Recent Labs  Lab 04/16/18 0638 04/16/18 0904  ALBUMIN 2.0* 2.0*   No results for input(s): LIPASE, AMYLASE in the last 168 hours. No results for input(s): AMMONIA in the last 168 hours.  CBC: Recent Labs  Lab 04/16/18 0638 04/16/18 0904 04/18/18 0525  WBC 10.7* 11.1* 13.5*  HGB 9.3*  9.3* 9.1*  HCT 31.1* 30.5* 29.6*  MCV 95.4 94.7 94.6  PLT 178 215 262    Cardiac Enzymes: No results for input(s): CKTOTAL, CKMB, CKMBINDEX, TROPONINI in the last 168 hours.  BNP (last 3 results) No results for input(s): BNP in the last 8760 hours.  ProBNP (last 3 results) No results for input(s): PROBNP in the last 8760 hours.  Radiological Exams on Admission: No results found.  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 Acute on chronic respiratory failure with hypoxia we will continue with T collar at baseline continue aggressive pulmonary toilet supportive care patient prognosis remains guarded. 2. Chronic atrial fibrillation rate is controlled we will continue present management. 3. Pneumonia due to aspiration treated we will follow 4. Pulmonary embolism treated stable at this time 5. Severe sepsis hemodynamically stable we will continue present management. 6. Acute tubular necrosis followed by nephrology continue present therapy   I have personally seen and evaluated the patient, evaluated laboratory and imaging results, formulated the assessment and plan and placed orders. The Patient requires  high complexity decision making for assessment and support.  Case was discussed on Rounds with the Respiratory Therapy Staff  Allyne Gee, MD [facility] Pulmonary Critical Care Medicine Sleep Medicine

## 2018-04-21 LAB — PROTIME-INR
INR: 3.05
PROTHROMBIN TIME: 31.3 s — AB (ref 11.4–15.2)

## 2018-04-21 LAB — POTASSIUM: POTASSIUM: 3.1 mmol/L — AB (ref 3.5–5.1)

## 2018-04-21 LAB — VANCOMYCIN, TROUGH: Vancomycin Tr: 35 ug/mL (ref 15–20)

## 2018-04-22 LAB — BASIC METABOLIC PANEL
ANION GAP: 11 (ref 5–15)
BUN: 107 mg/dL — AB (ref 6–20)
CHLORIDE: 104 mmol/L (ref 101–111)
CO2: 21 mmol/L — ABNORMAL LOW (ref 22–32)
Calcium: 8.1 mg/dL — ABNORMAL LOW (ref 8.9–10.3)
Creatinine, Ser: 2.92 mg/dL — ABNORMAL HIGH (ref 0.61–1.24)
GFR calc Af Amer: 24 mL/min — ABNORMAL LOW (ref >60)
GFR, EST NON AFRICAN AMERICAN: 20 mL/min — AB (ref >60)
Glucose, Bld: 159 mg/dL — ABNORMAL HIGH (ref 65–99)
POTASSIUM: 3.4 mmol/L — AB (ref 3.5–5.1)
SODIUM: 136 mmol/L (ref 135–145)

## 2018-04-22 LAB — PROTIME-INR
INR: 2.45
PROTHROMBIN TIME: 26.4 s — AB (ref 11.4–15.2)

## 2018-04-22 LAB — VANCOMYCIN, TROUGH: VANCOMYCIN TR: 27 ug/mL — AB (ref 15–20)

## 2018-04-23 DIAGNOSIS — J690 Pneumonitis due to inhalation of food and vomit: Secondary | ICD-10-CM | POA: Diagnosis not present

## 2018-04-23 DIAGNOSIS — N170 Acute kidney failure with tubular necrosis: Secondary | ICD-10-CM | POA: Diagnosis not present

## 2018-04-23 DIAGNOSIS — J9621 Acute and chronic respiratory failure with hypoxia: Secondary | ICD-10-CM | POA: Diagnosis not present

## 2018-04-23 DIAGNOSIS — I482 Chronic atrial fibrillation: Secondary | ICD-10-CM | POA: Diagnosis not present

## 2018-04-23 LAB — CBC
HEMATOCRIT: 26 % — AB (ref 39.0–52.0)
HEMOGLOBIN: 8.3 g/dL — AB (ref 13.0–17.0)
MCH: 28.5 pg (ref 26.0–34.0)
MCHC: 31.9 g/dL (ref 30.0–36.0)
MCV: 89.3 fL (ref 78.0–100.0)
Platelets: 355 10*3/uL (ref 150–400)
RBC: 2.91 MIL/uL — ABNORMAL LOW (ref 4.22–5.81)
RDW: 15.6 % — ABNORMAL HIGH (ref 11.5–15.5)
WBC: 13.9 10*3/uL — ABNORMAL HIGH (ref 4.0–10.5)

## 2018-04-23 LAB — BASIC METABOLIC PANEL
Anion gap: 10 (ref 5–15)
BUN: 119 mg/dL — AB (ref 6–20)
CHLORIDE: 109 mmol/L (ref 101–111)
CO2: 23 mmol/L (ref 22–32)
CREATININE: 2.98 mg/dL — AB (ref 0.61–1.24)
Calcium: 8.2 mg/dL — ABNORMAL LOW (ref 8.9–10.3)
GFR calc Af Amer: 23 mL/min — ABNORMAL LOW (ref >60)
GFR calc non Af Amer: 20 mL/min — ABNORMAL LOW (ref >60)
GLUCOSE: 151 mg/dL — AB (ref 65–99)
Potassium: 3.7 mmol/L (ref 3.5–5.1)
Sodium: 142 mmol/L (ref 135–145)

## 2018-04-23 LAB — VANCOMYCIN, TROUGH: Vancomycin Tr: 21 ug/mL (ref 15–20)

## 2018-04-23 LAB — PROTIME-INR
INR: 2.13
Prothrombin Time: 23.6 seconds — ABNORMAL HIGH (ref 11.4–15.2)

## 2018-04-23 LAB — MAGNESIUM: Magnesium: 2.3 mg/dL (ref 1.7–2.4)

## 2018-04-23 NOTE — Progress Notes (Signed)
Pulmonary Critical Care Medicine [facility] [place]   PULMONARY SERVICE  PROGRESS NOTE  Date of Service: 04/23/2018  TRUE GARCIAMARTINEZ  ZOX:[id number]  DOB: 1946-10-30   DOA: 04/18/2018  Referring Physician: Carron Curie, MD  HPI: JOSIAH NIETO is a 72 y.o. male seen for follow up of Acute on Chronic Respiratory Failure.  Doing fine with T collar at this time patient is on 20% oxygen.  Good saturations are noted.  Comfortable without distress at this time.  Medications: Reviewed on Rounds  Physical Exam:  Vitals: Temperature 99.5 pulse 91 respiratory rate 18 blood pressure 135/69 saturations are 99%  Ventilator Settings on T collar FiO2 28%  . General: Comfortable at this time . Eyes: Grossly normal lids, irises & conjunctiva . ENT: grossly tongue is normal . Neck: no obvious mass . Cardiovascular: S1 S2 normal no gallop . Respiratory: No rhonchi or rales expansion is equal . Abdomen: soft . Skin: no rash seen on limited exam . Musculoskeletal: not rigid . Psychiatric:unable to assess . Neurologic: no seizure no involuntary movements         Labs on Admission:  Basic Metabolic Panel: Recent Labs  Lab 04/18/18 0525 04/19/18 0539 04/20/18 0645 04/21/18 0220 04/22/18 0523 04/23/18 0440  NA 147* 144 141  --  136 142  K 3.1* 3.0* 3.3* 3.1* 3.4* 3.7  CL 113* 112* 108  --  104 109  CO2 --  21* 23  GLUCOSE 170* 164* 163*  --  159* 151*  BUN 86* 93* 99*  --  107* 119*  CREATININE 2.61* 2.83* 3.02*  --  2.92* 2.98*  CALCIUM 8.3* 8.0* 8.0*  --  8.1* 8.2*  MG 2.5*  --   --   --   --  2.3    Liver Function Tests: No results for input(s): AST, ALT, ALKPHOS, BILITOT, PROT, ALBUMIN in the last 168 hours. No results for input(s): LIPASE, AMYLASE in the last 168 hours. No results for input(s): AMMONIA in the last 168 hours.  CBC: Recent Labs  Lab 04/18/18 0525 04/23/18 0440  WBC 13.5* 13.9*  HGB 9.1* 8.3*  HCT 29.6* 26.0*  MCV 94.6 89.3   PLT 262 355    Cardiac Enzymes: No results for input(s): CKTOTAL, CKMB, CKMBINDEX, TROPONINI in the last 168 hours.  BNP (last 3 results) No results for input(s): BNP in the last 8760 hours.  ProBNP (last 3 results) No results for input(s): PROBNP in the last 8760 hours.  Radiological Exams on Admission: No results found.  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 Acute on chronic respiratory failure with hypoxia and continues to do fine on T collar.  Patient's family was in the room explained to the family that he has had significant secretion issues.  We are going to go ahead and change his trach to a cuffless type of trach today.  Perhaps this may actually help with his secretions. 2. Chronic atrial fibrillation rate is controlled at this time.  Patient has had no episodes of tachycardia we will continue to monitor. 3. Stroke due to basilar artery occlusion at baseline grossly unchanged remains nonverbal nonresponsive. 4. Pneumonia due to aspiration treated with  antibiotics we will continue to follow 5. Pulmonary embolism at baseline we will continue with supportive care 6. Severe sepsis with shock hemodynamically stable now 7. Acute tubular necrosis follow labs   I have personally seen and evaluated the patient, evaluated laboratory and imaging results, formulated the assessment and plan and placed orders. The Patient requires high complexity decision making for assessment and support.  Case was discussed on Rounds with the Respiratory Therapy Staff  Yevonne Pax, MD [facility] Pulmonary Critical Care Medicine Sleep Medicine

## 2018-04-23 NOTE — Progress Notes (Signed)
[name]  ROUNDING NOTE   Subjective:  After stopping dialysis it appears that azotemia is worsening again.  BUN up to 119 now with a creatinine of 2.98.  Objective:  Vital signs in last 24 hours:  Temperature 97.6 pulse 88 respirations 21 blood pressure 151/78  Physical Exam: General: No acute distress  Head: Normocephalic, atraumatic. Moist oral mucosal membranes  Eyes: Anicteric  Neck: Tracheostomy present  Lungs:  Scattered rhonchi, normal effort  Heart: S1S2 no rubs  Abdomen:  Soft, nontender, bowel sounds present, PEG present  Extremities: no peripheral edema.  Neurologic: Arousable, will track with eyes  Skin: No lesions  Access: Dialysis catheter removed    Basic Metabolic Panel: Recent Labs  Lab 04/18/18 0525 04/19/18 0539 04/20/18 0645 04/21/18 0220 04/22/18 0523 04/23/18 0440  NA 147* 144 141  --  136 142  K 3.1* 3.0* 3.3* 3.1* 3.4* 3.7  CL 113* 112* 108  --  104 109  CO2 --  21* 23  GLUCOSE 170* 164* 163*  --  159* 151*  BUN 86* 93* 99*  --  107* 119*  CREATININE 2.61* 2.83* 3.02*  --  2.92* 2.98*  CALCIUM 8.3* 8.0* 8.0*  --  8.1* 8.2*  MG 2.5*  --   --   --   --  2.3    Liver Function Tests: No results for input(s): AST, ALT, ALKPHOS, BILITOT, PROT, ALBUMIN in the last 168 hours. No results for input(s): LIPASE, AMYLASE in the last 168 hours. No results for input(s): AMMONIA in the last 168 hours.  CBC: Recent Labs  Lab 04/18/18 0525 04/23/18 0440  WBC 13.5* 13.9*  HGB 9.1* 8.3*  HCT 29.6* 26.0*  MCV 94.6 89.3  PLT 262 355    Cardiac Enzymes: No results for input(s): CKTOTAL, CKMB, CKMBINDEX, TROPONINI in the last 168 hours.  BNP: Invalid input(s): POCBNP  CBG: No results for input(s): GLUCAP in the last 168 hours.  Microbiology: Results for orders placed or performed during the hospital encounter of 04/19/2018  C difficile quick scan w PCR reflex     Status: None   Collection Time: 04/18/2018  3:01 PM   Result Value Ref Range Status   C Diff antigen NEGATIVE NEGATIVE Final   C Diff toxin NEGATIVE NEGATIVE Final   C Diff interpretation No C. difficile detected.  Final    Comment: Performed at [facility], [address], [place], [place] [address]  Culture, Urine     Status: None   Collection Time: 03/29/18 10:45 PM  Result Value Ref Range Status   Specimen Description URINE, RANDOM  Final   Special Requests NONE  Final   Culture   Final    NO GROWTH Performed at [facility], [address], [place], [place] [address]    Report Status 03/31/2018 FINAL  Final  Culture, respiratory (NON-Expectorated)     Status: None   Collection Time: 03/30/18  8:55 AM  Result Value Ref Range Status   Specimen Description TRACHEAL ASPIRATE  Final   Special Requests NONE  Final   Gram Stain   Final    MODERATE WBC PRESENT, PREDOMINANTLY PMN MODERATE SQUAMOUS EPITHELIAL CELLS PRESENT FEW GRAM POSITIVE COCCI FEW GRAM POSITIVE RODS    Culture   Final    Consistent with normal respiratory flora. Performed at [facility], [address] [place], [place] [address]    Report Status 04/01/2018 FINAL  Final  Culture, respiratory (NON-Expectorated)     Status: None   Collection Time: 04/05/18  4:14 PM  Result Value Ref Range Status   Specimen Description TRACHEAL ASPIRATE  Final   Special Requests NONE  Final   Gram Stain   Final    ABUNDANT WBC PRESENT, PREDOMINANTLY PMN MODERATE GRAM POSITIVE COCCI MODERATE GRAM VARIABLE ROD    Culture   Final    MODERATE Consistent with normal respiratory flora. Performed at [facility], [address], [place], [place] [address]    Report Status 04/07/2018 FINAL  Final  Culture, Urine     Status: None   Collection Time: 04/05/18  4:45 PM  Result Value Ref Range Status   Specimen Description URINE, RANDOM  Final   Special Requests NONE  Final   Culture   Final    NO GROWTH Performed at [facility], [address] [place], [place] [address]    Report Status 04/06/2018 FINAL  Final  Culture, blood (routine x 2)     Status: None   Collection Time: 04/06/18  3:19 PM  Result Value Ref Range Status   Specimen Description BLOOD RIGHT HAND  Final   Special Requests   Final    BOTTLES DRAWN AEROBIC ONLY Blood Culture adequate volume   Culture   Final    NO GROWTH 5 DAYS Performed at [facility], [address], [place], [place] [address]    Report Status 04/11/2018 FINAL  Final  Culture, blood (routine x 2)     Status: Abnormal   Collection Time: 04/06/18  3:28 PM  Result Value Ref Range Status   Specimen Description BLOOD LEFT HAND  Final   Special Requests   Final    BOTTLES DRAWN AEROBIC AND ANAEROBIC Blood Culture adequate volume   Culture  Setup Time   Final    GRAM POSITIVE COCCI IN CLUSTERS IN BOTH AEROBIC AND ANAEROBIC BOTTLES GRAM POSITIVE RODS AEROBIC BOTTLE ONLY CRITICAL RESULT CALLED TO, READ BACK BY AND VERIFIED WITH: Jannett Celestine [id number] 04/07/2018 T. TYSOR    Culture (A)  Final    BACILLUS SPECIES Standardized susceptibility testing for this organism is not available. STAPHYLOCOCCUS SPECIES (COAGULASE NEGATIVE) SUSCEPTIBILITIES PERFORMED ON PREVIOUS CULTURE WITHIN THE LAST 5 DAYS. Performed at [facility], [address] [place], [place] [address]    Report Status 04/14/2018 FINAL  Final  Blood Culture ID Panel (Reflexed)     Status: Abnormal   Collection Time: 04/06/18  3:28 PM  Result Value Ref Range Status   Enterococcus species NOT DETECTED NOT DETECTED Final   Listeria monocytogenes NOT DETECTED NOT DETECTED Final   Staphylococcus species DETECTED (A) NOT DETECTED Final    Comment: Methicillin (oxacillin) resistant coagulase negative staphylococcus. Possible blood culture contaminant (unless isolated from more than one blood culture draw or clinical case suggests pathogenicity). No antibiotic treatment is indicated for blood  culture contaminants. CRITICAL  RESULT CALLED TO, READ BACK BY AND VERIFIED WITH: N. OLUBODUN,RN [id number] 04/07/2018 T. TYSOR    Staphylococcus aureus NOT DETECTED NOT DETECTED Final   Methicillin resistance DETECTED (A) NOT DETECTED Final    Comment: CRITICAL RESULT CALLED TO, READ BACK BY AND VERIFIED WITH: N. OLUBODUN,RN [id number] 04/07/2018 T. TYSOR    Streptococcus species NOT DETECTED NOT DETECTED Final   Streptococcus agalactiae NOT DETECTED NOT DETECTED Final   Streptococcus pneumoniae NOT DETECTED NOT DETECTED Final   Streptococcus pyogenes NOT DETECTED NOT DETECTED Final  Acinetobacter baumannii NOT DETECTED NOT DETECTED Final   Enterobacteriaceae species NOT DETECTED NOT DETECTED Final   Enterobacter cloacae complex NOT DETECTED NOT DETECTED Final   Escherichia coli NOT DETECTED NOT DETECTED Final   Klebsiella oxytoca NOT DETECTED NOT DETECTED Final   Klebsiella pneumoniae NOT DETECTED NOT DETECTED Final   Proteus species NOT DETECTED NOT DETECTED Final   Serratia marcescens NOT DETECTED NOT DETECTED Final   Haemophilus influenzae NOT DETECTED NOT DETECTED Final   Neisseria meningitidis NOT DETECTED NOT DETECTED Final   Pseudomonas aeruginosa NOT DETECTED NOT DETECTED Final   Candida albicans NOT DETECTED NOT DETECTED Final   Candida glabrata NOT DETECTED NOT DETECTED Final   Candida krusei NOT DETECTED NOT DETECTED Final   Candida parapsilosis NOT DETECTED NOT DETECTED Final   Candida tropicalis NOT DETECTED NOT DETECTED Final  Culture, blood (routine x 2)     Status: Abnormal   Collection Time: 04/10/18 10:35 AM  Result Value Ref Range Status   Specimen Description BLOOD RIGHT HAND  Final   Special Requests   Final    BOTTLES DRAWN AEROBIC ONLY Blood Culture results may not be optimal due to an inadequate volume of blood received in culture bottles   Culture  Setup Time   Final    AEROBIC BOTTLE ONLY GRAM POSITIVE COCCI CRITICAL VALUE NOTED.  VALUE IS CONSISTENT WITH PREVIOUSLY REPORTED AND CALLED  VALUE. Performed at [facility], [address] [place], [place] [address]    Culture STAPHYLOCOCCUS SPECIES (COAGULASE NEGATIVE) (A)  Final   Report Status 04/14/2018 FINAL  Final   Organism ID, Bacteria STAPHYLOCOCCUS SPECIES (COAGULASE NEGATIVE)  Final      Susceptibility   Staphylococcus species (coagulase negative) - MIC*    CIPROFLOXACIN >=8 RESISTANT Resistant     ERYTHROMYCIN >=8 RESISTANT Resistant     GENTAMICIN 8 INTERMEDIATE Intermediate     OXACILLIN >=4 RESISTANT Resistant     TETRACYCLINE 2 SENSITIVE Sensitive     VANCOMYCIN 2 SENSITIVE Sensitive     TRIMETH/SULFA 80 RESISTANT Resistant     CLINDAMYCIN >=8 RESISTANT Resistant     RIFAMPIN <=0.5 SENSITIVE Sensitive     Inducible Clindamycin NEGATIVE Sensitive     * STAPHYLOCOCCUS SPECIES (COAGULASE NEGATIVE)  Culture, blood (routine x 2)     Status: None   Collection Time: 04/10/18 10:42 AM  Result Value Ref Range Status   Specimen Description BLOOD RIGHT ARM  Final   Special Requests   Final    BOTTLES DRAWN AEROBIC ONLY Blood Culture results may not be optimal due to an inadequate volume of blood received in culture bottles   Culture   Final    NO GROWTH 5 DAYS Performed at [facility], [address], [place], [place] [address]    Report Status 04/15/2018 FINAL  Final  Culture, respiratory (NON-Expectorated)     Status: None   Collection Time: 04/10/18  3:15 PM  Result Value Ref Range Status   Specimen Description TRACHEAL ASPIRATE  Final   Special Requests NONE  Final   Gram Stain   Final    ABUNDANT WBC PRESENT,BOTH PMN AND MONONUCLEAR FEW GRAM POSITIVE COCCI MODERATE GRAM VARIABLE ROD    Culture   Final    Consistent with normal respiratory flora. Performed at [facility], [address], [place], [place] [address]    Report Status 04/12/2018 FINAL  Final  Culture, Urine     Status: Abnormal   Collection Time:  04/10/18  4:15 PM  Result Value Ref Range Status   Specimen  Description URINE, RANDOM  Final   Special Requests   Final    NONE Performed at [facility], [address], [place], [place] [address]    Culture MULTIPLE SPECIES PRESENT, SUGGEST RECOLLECTION (A)  Final   Report Status 04/11/2018 FINAL  Final  Culture, Urine     Status: None   Collection Time: 04/13/18  6:00 PM  Result Value Ref Range Status   Specimen Description URINE, RANDOM  Final   Special Requests NONE  Final   Culture   Final    NO GROWTH Performed at [facility], [address], [place], [place] [address]    Report Status 04/15/2018 FINAL  Final  Culture, blood (routine x 2)     Status: Abnormal   Collection Time: 04/15/18  2:15 PM  Result Value Ref Range Status   Specimen Description BLOOD LEFT HAND  Final   Special Requests   Final    BOTTLES DRAWN AEROBIC AND ANAEROBIC Blood Culture adequate volume   Culture  Setup Time   Final    GRAM POSITIVE COCCI ANAEROBIC BOTTLE ONLY CRITICAL VALUE NOTED.  VALUE IS CONSISTENT WITH PREVIOUSLY REPORTED AND CALLED VALUE.    Culture (A)  Final    STAPHYLOCOCCUS SPECIES (COAGULASE NEGATIVE) SUSCEPTIBILITIES PERFORMED ON PREVIOUS CULTURE WITHIN THE LAST 5 DAYS. Performed at [facility], [address], [place], [place] [address]    Report Status 04/18/2018 FINAL  Final  Culture, blood (routine x 2)     Status: Abnormal   Collection Time: 04/15/18  2:25 PM  Result Value Ref Range Status   Specimen Description BLOOD RIGHT HAND  Final   Special Requests   Final    BOTTLES DRAWN AEROBIC AND ANAEROBIC Blood Culture adequate volume   Culture  Setup Time   Final    GRAM POSITIVE COCCI ANAEROBIC BOTTLE ONLY CRITICAL VALUE NOTED.  VALUE IS CONSISTENT WITH PREVIOUSLY REPORTED AND CALLED VALUE.    Culture (A)  Final    STAPHYLOCOCCUS SPECIES (COAGULASE NEGATIVE) SUSCEPTIBILITIES PERFORMED ON PREVIOUS CULTURE WITHIN THE LAST 5 DAYS. Performed at [facility], [address], [place], [place] [address]     Report Status 04/18/2018 FINAL  Final  Culture, respiratory (NON-Expectorated)     Status: None   Collection Time: 04/17/18  1:45 PM  Result Value Ref Range Status   Specimen Description TRACHEAL ASPIRATE  Final   Special Requests NONE  Final   Gram Stain   Final    FEW WBC PRESENT,BOTH PMN AND MONONUCLEAR FEW GRAM POSITIVE COCCI ABUNDANT GRAM VARIABLE ROD FEW YEAST MODERATE SQUAMOUS EPITHELIAL CELLS PRESENT    Culture   Final    MODERATE Consistent with normal respiratory flora. Performed at [facility], [address], [place], [place] [address]    Report Status 04/20/2018 FINAL  Final  Culture, blood (routine x 2)     Status: None (Preliminary result)   Collection Time: 04/21/18  9:39 AM  Result Value Ref Range Status   Specimen Description BLOOD LEFT FOREARM  Final   Special Requests   Final    BOTTLES DRAWN AEROBIC AND ANAEROBIC Blood Culture adequate volume   Culture   Final    NO GROWTH < 24 HOURS Performed at [facility], [address], [place], [place] [address]    Report Status PENDING  Incomplete  Culture, blood (routine  x 2)     Status: None (Preliminary result)   Collection Time: 04/21/18 11:24 AM  Result Value Ref Range Status   Specimen Description BLOOD LEFT HAND  Final   Special Requests   Final    BOTTLES DRAWN AEROBIC ONLY Blood Culture results may not be optimal due to an inadequate volume of blood received in culture bottles   Culture   Final    NO GROWTH < 24 HOURS Performed at [facility], [address] [place], [place] [address]    Report Status PENDING  Incomplete    Coagulation Studies: Recent Labs    04/21/18 0220 04/22/18 0523 04/23/18 0440  LABPROT 31.3* 26.4* 23.6*  INR 3.05 2.45 2.13    Urinalysis: No results for input(s): COLORURINE, LABSPEC, PHURINE, GLUCOSEU, HGBUR, BILIRUBINUR, KETONESUR, PROTEINUR, UROBILINOGEN, NITRITE, LEUKOCYTESUR in the last 72 hours.  Invalid input(s): APPERANCEUR     Imaging: No results found.   Medications:       Assessment/ Plan:  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  Patient initially presented outside hospital with facial droop as well as dysarthria.  During prior hospitalization he also developed acute respiratory failure and is status post tracheostomy placement as well as PEG tube placement.  In addition he had worsening renal function during the course of the hospitalization and temporary left internal jugular dialysis catheter was placed and he was initiated on dialysis immediately before transfer here.   1.  Acute renal failure secondary to ATN. 2.  Acute respiratory failure. 3.  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 4.  Anemia unspecified. 5.  MRSA sepsis secondary to dialysis catheter. 6.  Hypernatremia.  Plan:  As before BUN continues to slowly rise was 119 today.  Creatinine remains relatively stable at 2.9.  He continues to make urine.  If this trend continues we will likely restart patient on dialysis on Thursday.  Otherwise for now continue supportive care.  Overall prognosis guarded.   lOS: 0 Curtis Hopkins 5/27/20192:09 PM

## 2018-04-24 DIAGNOSIS — I482 Chronic atrial fibrillation: Secondary | ICD-10-CM | POA: Diagnosis not present

## 2018-04-24 DIAGNOSIS — J690 Pneumonitis due to inhalation of food and vomit: Secondary | ICD-10-CM | POA: Diagnosis not present

## 2018-04-24 DIAGNOSIS — N170 Acute kidney failure with tubular necrosis: Secondary | ICD-10-CM | POA: Diagnosis not present

## 2018-04-24 DIAGNOSIS — J9621 Acute and chronic respiratory failure with hypoxia: Secondary | ICD-10-CM | POA: Diagnosis not present

## 2018-04-24 LAB — PROTIME-INR
INR: 2
Prothrombin Time: 22.5 seconds — ABNORMAL HIGH (ref 11.4–15.2)

## 2018-04-24 NOTE — Progress Notes (Signed)
Pulmonary Critical Care Medicine [facility]   PULMONARY SERVICE  PROGRESS NOTE  Date of Service: 04/24/2018  Curtis Hopkins  WJX:[id number]  DOB: 11-19-1946   DOA: 04/17/2018  Referring Physician: Carron Curie, MD  HPI: Curtis Hopkins is a 72 y.o. male seen for follow up of Acute on Chronic Respiratory Failure.  Patient is on T collar at this time.  Is comfortable without distress.  Secretions are actually improving right now.  Medications: Reviewed on Rounds  Physical Exam:  Vitals: Temperature 101.1 pulse 92 respiratory rate 18 blood pressure 139/68 saturations 97%  Ventilator Settings off of the ventilator at this time  . General: Comfortable at this time . Eyes: Grossly normal lids, irises & conjunctiva . ENT: grossly tongue is normal . Neck: no obvious mass . Cardiovascular: S1 S2 normal no gallop . Respiratory: Coarse breath sounds scattered rhonchi . Abdomen: soft . Skin: no rash seen on limited exam . Musculoskeletal: not rigid . Psychiatric:unable to assess . Neurologic: no seizure no involuntary movements         Labs on Admission:  Basic Metabolic Panel: Recent Labs  Lab 04/18/18 0525 04/19/18 0539 04/20/18 0645 04/21/18 0220 04/22/18 0523 04/23/18 0440  NA 147* 144 141  --  136 142  K 3.1* 3.0* 3.3* 3.1* 3.4* 3.7  CL 113* 112* 108  --  104 109  CO2 --  21* 23  GLUCOSE 170* 164* 163*  --  159* 151*  BUN 86* 93* 99*  --  107* 119*  CREATININE 2.61* 2.83* 3.02*  --  2.92* 2.98*  CALCIUM 8.3* 8.0* 8.0*  --  8.1* 8.2*  MG 2.5*  --   --   --   --  2.3    Liver Function Tests: No results for input(s): AST, ALT, ALKPHOS, BILITOT, PROT, ALBUMIN in the last 168 hours. No results for input(s): LIPASE, AMYLASE in the last 168 hours. No results for input(s): AMMONIA in the last 168 hours.  CBC: Recent Labs  Lab 04/18/18 0525 04/23/18 0440  WBC 13.5* 13.9*  HGB 9.1* 8.3*  HCT 29.6* 26.0*  MCV 94.6 89.3  PLT 262  355    Cardiac Enzymes: No results for input(s): CKTOTAL, CKMB, CKMBINDEX, TROPONINI in the last 168 hours.  BNP (last 3 results) No results for input(s): BNP in the last 8760 hours.  ProBNP (last 3 results) No results for input(s): PROBNP in the last 8760 hours.  Radiological Exams on Admission: No results found.  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 Acute on chronic respiratory failure with hypoxia we will continue with T collar patient's at baseline 2. Chronic atrial fibrillation rate is controlled 3. Stroke at baseline continue restorative therapy 4. Pneumonia due to aspiration treated with antibiotics 5. Pulmonary embolism stable for now 6. Severe sepsis with shock hemodynamically stable 7. Acute renal failure tubular necrosis followed by nephrology for dialysis   I have personally seen and evaluated the patient, evaluated laboratory and imaging results, formulated the assessment and plan and placed orders. The Patient requires high complexity decision making for assessment and support.  Case was discussed on Rounds with the Respiratory Therapy Staff  Kashae Carstens A  Humphrey Rolls, MD [facility] Pulmonary Critical Care Medicine Sleep Medicine

## 2018-04-25 DIAGNOSIS — J9621 Acute and chronic respiratory failure with hypoxia: Secondary | ICD-10-CM | POA: Diagnosis not present

## 2018-04-25 DIAGNOSIS — J690 Pneumonitis due to inhalation of food and vomit: Secondary | ICD-10-CM | POA: Diagnosis not present

## 2018-04-25 DIAGNOSIS — N170 Acute kidney failure with tubular necrosis: Secondary | ICD-10-CM | POA: Diagnosis not present

## 2018-04-25 DIAGNOSIS — I482 Chronic atrial fibrillation: Secondary | ICD-10-CM | POA: Diagnosis not present

## 2018-04-25 LAB — RENAL FUNCTION PANEL
ALBUMIN: 1.8 g/dL — AB (ref 3.5–5.0)
Anion gap: 14 (ref 5–15)
BUN: 149 mg/dL — AB (ref 6–20)
CALCIUM: 8.4 mg/dL — AB (ref 8.9–10.3)
CO2: 23 mmol/L (ref 22–32)
Chloride: 111 mmol/L (ref 101–111)
Creatinine, Ser: 3.25 mg/dL — ABNORMAL HIGH (ref 0.61–1.24)
GFR calc Af Amer: 21 mL/min — ABNORMAL LOW (ref >60)
GFR calc non Af Amer: 18 mL/min — ABNORMAL LOW (ref >60)
Glucose, Bld: 152 mg/dL — ABNORMAL HIGH (ref 65–99)
PHOSPHORUS: 5.2 mg/dL — AB (ref 2.5–4.6)
Potassium: 4.1 mmol/L (ref 3.5–5.1)
SODIUM: 148 mmol/L — AB (ref 135–145)

## 2018-04-25 LAB — CBC
HCT: 27.7 % — ABNORMAL LOW (ref 39.0–52.0)
Hemoglobin: 8.4 g/dL — ABNORMAL LOW (ref 13.0–17.0)
MCH: 27.7 pg (ref 26.0–34.0)
MCHC: 30.3 g/dL (ref 30.0–36.0)
MCV: 91.4 fL (ref 78.0–100.0)
Platelets: 377 10*3/uL (ref 150–400)
RBC: 3.03 MIL/uL — ABNORMAL LOW (ref 4.22–5.81)
RDW: 16.3 % — AB (ref 11.5–15.5)
WBC: 13 10*3/uL — ABNORMAL HIGH (ref 4.0–10.5)

## 2018-04-25 LAB — PROTIME-INR
INR: 1.92
PROTHROMBIN TIME: 21.8 s — AB (ref 11.4–15.2)

## 2018-04-25 LAB — MAGNESIUM: Magnesium: 2.7 mg/dL — ABNORMAL HIGH (ref 1.7–2.4)

## 2018-04-25 NOTE — Progress Notes (Signed)
[name]  ROUNDING NOTE   Subjective:  The patient's renal function is worsening. BUN up to 149 with a creatinine of 3.25. Serum sodium also high at 148.  Objective:  Vital signs in last 24 hours:  Temperature 99.5 pulse 80 respirations 20 blood pressure 143/75  Physical Exam: General: No acute distress  Head: Normocephalic, atraumatic. Moist oral mucosal membranes  Eyes: Anicteric  Neck: Tracheostomy present  Lungs:  Scattered rhonchi, normal effort  Heart: S1S2 no rubs  Abdomen:  Soft, nontender, bowel sounds present, PEG present  Extremities: no peripheral edema.  Neurologic: Arousable, will track with eyes  Skin: No lesions  Access: Dialysis catheter removed    Basic Metabolic Panel: Recent Labs  Lab 04/19/18 0539 04/20/18 0645 04/21/18 0220 04/22/18 0523 04/23/18 0440 04/25/18 0814 04/25/18 0828  NA 144 141  --  136 142  --  148*  K 3.0* 3.3* 3.1* 3.4* 3.7  --  4.1  CL 112* 108  --  104 109  --  111  CO2 22 22  --  21* 23  --  23  GLUCOSE 164* 163*  --  159* 151*  --  152*  BUN 93* 99*  --  107* 119*  --  149*  CREATININE 2.83* 3.02*  --  2.92* 2.98*  --  3.25*  CALCIUM 8.0* 8.0*  --  8.1* 8.2*  --  8.4*  MG  --   --   --   --  2.3 2.7*  --   PHOS  --   --   --   --   --   --  5.2*    Liver Function Tests: Recent Labs  Lab 04/25/18 0828  ALBUMIN 1.8*   No results for input(s): LIPASE, AMYLASE in the last 168 hours. No results for input(s): AMMONIA in the last 168 hours.  CBC: Recent Labs  Lab 04/23/18 0440 04/25/18 0814  WBC 13.9* 13.0*  HGB 8.3* 8.4*  HCT 26.0* 27.7*  MCV 89.3 91.4  PLT 355 377    Cardiac Enzymes: No results for input(s): CKTOTAL, CKMB, CKMBINDEX, TROPONINI in the last 168 hours.  BNP: Invalid input(s): POCBNP  CBG: No results for input(s): GLUCAP in the last 168 hours.  Microbiology: Results for orders placed or performed during the hospital encounter of 2018/04/03  C difficile quick scan w PCR  reflex     Status: None   Collection Time: 03-Apr-2018  3:01 PM  Result Value Ref Range Status   C Diff antigen NEGATIVE NEGATIVE Final   C Diff toxin NEGATIVE NEGATIVE Final   C Diff interpretation No C. difficile detected.  Final    Comment: Performed at [facility], [address] [place], [place] [address]  Culture, Urine     Status: None   Collection Time: 03/29/18 10:45 PM  Result Value Ref Range Status   Specimen Description URINE, RANDOM  Final   Special Requests NONE  Final   Culture   Final    NO GROWTH Performed at [facility], [address], [place], [place] [address]    Report Status 03/31/2018 FINAL  Final  Culture, respiratory (NON-Expectorated)     Status: None   Collection Time: 03/30/18  8:55 AM  Result Value Ref Range Status   Specimen Description TRACHEAL ASPIRATE  Final   Special Requests NONE  Final   Gram Stain   Final    MODERATE WBC PRESENT, PREDOMINANTLY PMN MODERATE SQUAMOUS EPITHELIAL CELLS PRESENT  FEW GRAM POSITIVE COCCI FEW GRAM POSITIVE RODS    Culture   Final    Consistent with normal respiratory flora. Performed at [facility], [address], [place], [place] [address]    Report Status 04/01/2018 FINAL  Final  Culture, respiratory (NON-Expectorated)     Status: None   Collection Time: 04/05/18  4:14 PM  Result Value Ref Range Status   Specimen Description TRACHEAL ASPIRATE  Final   Special Requests NONE  Final   Gram Stain   Final    ABUNDANT WBC PRESENT, PREDOMINANTLY PMN MODERATE GRAM POSITIVE COCCI MODERATE GRAM VARIABLE ROD    Culture   Final    MODERATE Consistent with normal respiratory flora. Performed at [facility], [address], [place], [place] [address]    Report Status 04/07/2018 FINAL  Final  Culture, Urine     Status: None   Collection Time: 04/05/18  4:45 PM  Result Value Ref Range Status   Specimen Description URINE, RANDOM  Final   Special Requests NONE  Final   Culture   Final    NO  GROWTH Performed at [facility], [address], [place], [place] [address]    Report Status 04/06/2018 FINAL  Final  Culture, blood (routine x 2)     Status: None   Collection Time: 04/06/18  3:19 PM  Result Value Ref Range Status   Specimen Description BLOOD RIGHT HAND  Final   Special Requests   Final    BOTTLES DRAWN AEROBIC ONLY Blood Culture adequate volume   Culture   Final    NO GROWTH 5 DAYS Performed at [facility], [address], [place], [place] [address]    Report Status 04/11/2018 FINAL  Final  Culture, blood (routine x 2)     Status: Abnormal   Collection Time: 04/06/18  3:28 PM  Result Value Ref Range Status   Specimen Description BLOOD LEFT HAND  Final   Special Requests   Final    BOTTLES DRAWN AEROBIC AND ANAEROBIC Blood Culture adequate volume   Culture  Setup Time   Final    GRAM POSITIVE COCCI IN CLUSTERS IN BOTH AEROBIC AND ANAEROBIC BOTTLES GRAM POSITIVE RODS AEROBIC BOTTLE ONLY CRITICAL RESULT CALLED TO, READ BACK BY AND VERIFIED WITH: Jannett Celestine [id number] 04/07/2018 T. TYSOR    Culture (A)  Final    BACILLUS SPECIES Standardized susceptibility testing for this organism is not available. STAPHYLOCOCCUS SPECIES (COAGULASE NEGATIVE) SUSCEPTIBILITIES PERFORMED ON PREVIOUS CULTURE WITHIN THE LAST 5 DAYS. Performed at [facility], [address], [place], [place] [address]    Report Status 04/14/2018 FINAL  Final  Blood Culture ID Panel (Reflexed)     Status: Abnormal   Collection Time: 04/06/18  3:28 PM  Result Value Ref Range Status   Enterococcus species NOT DETECTED NOT DETECTED Final   Listeria monocytogenes NOT DETECTED NOT DETECTED Final   Staphylococcus species DETECTED (A) NOT DETECTED Final    Comment: Methicillin (oxacillin) resistant coagulase negative staphylococcus. Possible blood culture contaminant (unless isolated from more than one blood culture draw or clinical case suggests pathogenicity). No antibiotic treatment  is indicated for blood  culture contaminants. CRITICAL RESULT CALLED TO, READ BACK BY AND VERIFIED WITH: N. OLUBODUN,RN [id number] 04/07/2018 T. TYSOR    Staphylococcus aureus NOT DETECTED NOT DETECTED Final   Methicillin resistance DETECTED (A) NOT DETECTED Final    Comment: CRITICAL RESULT CALLED TO, READ BACK BY AND  VERIFIED WITH: Jannett Celestine [id number] 04/07/2018 T. TYSOR    Streptococcus species NOT DETECTED NOT DETECTED Final   Streptococcus agalactiae NOT DETECTED NOT DETECTED Final   Streptococcus pneumoniae NOT DETECTED NOT DETECTED Final   Streptococcus pyogenes NOT DETECTED NOT DETECTED Final   Acinetobacter baumannii NOT DETECTED NOT DETECTED Final   Enterobacteriaceae species NOT DETECTED NOT DETECTED Final   Enterobacter cloacae complex NOT DETECTED NOT DETECTED Final   Escherichia coli NOT DETECTED NOT DETECTED Final   Klebsiella oxytoca NOT DETECTED NOT DETECTED Final   Klebsiella pneumoniae NOT DETECTED NOT DETECTED Final   Proteus species NOT DETECTED NOT DETECTED Final   Serratia marcescens NOT DETECTED NOT DETECTED Final   Haemophilus influenzae NOT DETECTED NOT DETECTED Final   Neisseria meningitidis NOT DETECTED NOT DETECTED Final   Pseudomonas aeruginosa NOT DETECTED NOT DETECTED Final   Candida albicans NOT DETECTED NOT DETECTED Final   Candida glabrata NOT DETECTED NOT DETECTED Final   Candida krusei NOT DETECTED NOT DETECTED Final   Candida parapsilosis NOT DETECTED NOT DETECTED Final   Candida tropicalis NOT DETECTED NOT DETECTED Final  Culture, blood (routine x 2)     Status: Abnormal   Collection Time: 04/10/18 10:35 AM  Result Value Ref Range Status   Specimen Description BLOOD RIGHT HAND  Final   Special Requests   Final    BOTTLES DRAWN AEROBIC ONLY Blood Culture results may not be optimal due to an inadequate volume of blood received in culture bottles   Culture  Setup Time   Final    AEROBIC BOTTLE ONLY GRAM POSITIVE COCCI CRITICAL VALUE NOTED.  VALUE IS  CONSISTENT WITH PREVIOUSLY REPORTED AND CALLED VALUE. Performed at [facility], [address], [place], [place] [address]    Culture STAPHYLOCOCCUS SPECIES (COAGULASE NEGATIVE) (A)  Final   Report Status 04/14/2018 FINAL  Final   Organism ID, Bacteria STAPHYLOCOCCUS SPECIES (COAGULASE NEGATIVE)  Final      Susceptibility   Staphylococcus species (coagulase negative) - MIC*    CIPROFLOXACIN >=8 RESISTANT Resistant     ERYTHROMYCIN >=8 RESISTANT Resistant     GENTAMICIN 8 INTERMEDIATE Intermediate     OXACILLIN >=4 RESISTANT Resistant     TETRACYCLINE 2 SENSITIVE Sensitive     VANCOMYCIN 2 SENSITIVE Sensitive     TRIMETH/SULFA 80 RESISTANT Resistant     CLINDAMYCIN >=8 RESISTANT Resistant     RIFAMPIN <=0.5 SENSITIVE Sensitive     Inducible Clindamycin NEGATIVE Sensitive     * STAPHYLOCOCCUS SPECIES (COAGULASE NEGATIVE)  Culture, blood (routine x 2)     Status: None   Collection Time: 04/10/18 10:42 AM  Result Value Ref Range Status   Specimen Description BLOOD RIGHT ARM  Final   Special Requests   Final    BOTTLES DRAWN AEROBIC ONLY Blood Culture results may not be optimal due to an inadequate volume of blood received in culture bottles   Culture   Final    NO GROWTH 5 DAYS Performed at [facility], [address], [place], [place] [address]    Report Status 04/15/2018 FINAL  Final  Culture, respiratory (NON-Expectorated)     Status: None   Collection Time: 04/10/18  3:15 PM  Result Value Ref Range Status   Specimen Description TRACHEAL ASPIRATE  Final   Special Requests NONE  Final   Gram Stain   Final    ABUNDANT WBC PRESENT,BOTH PMN AND MONONUCLEAR FEW GRAM POSITIVE COCCI MODERATE GRAM VARIABLE ROD  Culture   Final    Consistent with normal respiratory flora. Performed at [facility], [address], [place], [place] [address]    Report Status 04/12/2018 FINAL  Final  Culture, Urine     Status: Abnormal   Collection Time: 04/10/18  4:15 PM   Result Value Ref Range Status   Specimen Description URINE, RANDOM  Final   Special Requests   Final    NONE Performed at [facility], [address] [place], [place] [address]    Culture MULTIPLE SPECIES PRESENT, SUGGEST RECOLLECTION (A)  Final   Report Status 04/11/2018 FINAL  Final  Culture, Urine     Status: None   Collection Time: 04/13/18  6:00 PM  Result Value Ref Range Status   Specimen Description URINE, RANDOM  Final   Special Requests NONE  Final   Culture   Final    NO GROWTH Performed at [facility], [address], [place], [place] [address]    Report Status 04/15/2018 FINAL  Final  Culture, blood (routine x 2)     Status: Abnormal   Collection Time: 04/15/18  2:15 PM  Result Value Ref Range Status   Specimen Description BLOOD LEFT HAND  Final   Special Requests   Final    BOTTLES DRAWN AEROBIC AND ANAEROBIC Blood Culture adequate volume   Culture  Setup Time   Final    GRAM POSITIVE COCCI ANAEROBIC BOTTLE ONLY CRITICAL VALUE NOTED.  VALUE IS CONSISTENT WITH PREVIOUSLY REPORTED AND CALLED VALUE.    Culture (A)  Final    STAPHYLOCOCCUS SPECIES (COAGULASE NEGATIVE) SUSCEPTIBILITIES PERFORMED ON PREVIOUS CULTURE WITHIN THE LAST 5 DAYS. Performed at [facility], [address] [place], [place] [address]    Report Status 04/18/2018 FINAL  Final  Culture, blood (routine x 2)     Status: Abnormal   Collection Time: 04/15/18  2:25 PM  Result Value Ref Range Status   Specimen Description BLOOD RIGHT HAND  Final   Special Requests   Final    BOTTLES DRAWN AEROBIC AND ANAEROBIC Blood Culture adequate volume   Culture  Setup Time   Final    GRAM POSITIVE COCCI ANAEROBIC BOTTLE ONLY CRITICAL VALUE NOTED.  VALUE IS CONSISTENT WITH PREVIOUSLY REPORTED AND CALLED VALUE.    Culture (A)  Final    STAPHYLOCOCCUS SPECIES (COAGULASE NEGATIVE) SUSCEPTIBILITIES PERFORMED ON PREVIOUS CULTURE WITHIN THE LAST 5 DAYS. Performed at [facility],  [address], [place], [place] [address]    Report Status 04/18/2018 FINAL  Final  Culture, respiratory (NON-Expectorated)     Status: None   Collection Time: 04/17/18  1:45 PM  Result Value Ref Range Status   Specimen Description TRACHEAL ASPIRATE  Final   Special Requests NONE  Final   Gram Stain   Final    FEW WBC PRESENT,BOTH PMN AND MONONUCLEAR FEW GRAM POSITIVE COCCI ABUNDANT GRAM VARIABLE ROD FEW YEAST MODERATE SQUAMOUS EPITHELIAL CELLS PRESENT    Culture   Final    MODERATE Consistent with normal respiratory flora. Performed at [facility], [address], [place], [place] [address]    Report Status 04/20/2018 FINAL  Final  Culture, blood (routine x 2)     Status: None (Preliminary result)   Collection Time: 04/21/18  9:39 AM  Result Value Ref Range Status   Specimen Description BLOOD LEFT FOREARM  Final   Special Requests   Final    BOTTLES  DRAWN AEROBIC AND ANAEROBIC Blood Culture adequate volume   Culture   Final    NO GROWTH 4 DAYS Performed at [facility], [address], [place], [place] [address]    Report Status PENDING  Incomplete  Culture, blood (routine x 2)     Status: None (Preliminary result)   Collection Time: 04/21/18 11:24 AM  Result Value Ref Range Status   Specimen Description BLOOD LEFT HAND  Final   Special Requests   Final    BOTTLES DRAWN AEROBIC ONLY Blood Culture results may not be optimal due to an inadequate volume of blood received in culture bottles   Culture   Final    NO GROWTH 4 DAYS Performed at [facility], [address] [place], [place] [address]    Report Status PENDING  Incomplete    Coagulation Studies: Recent Labs    04/23/18 0440 04/24/18 0810 04/25/18 0814  LABPROT 23.6* 22.5* 21.8*  INR 2.13 2.00 1.92    Urinalysis: No results for input(s): COLORURINE, LABSPEC, PHURINE, GLUCOSEU, HGBUR, BILIRUBINUR, KETONESUR, PROTEINUR, UROBILINOGEN, NITRITE, LEUKOCYTESUR in the last 72 hours.  Invalid  input(s): APPERANCEUR    Imaging: No results found.   Medications:       Assessment/ Plan:  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  Patient initially presented outside hospital with facial droop as well as dysarthria.  During prior hospitalization he also developed acute respiratory failure and is status post tracheostomy placement as well as PEG tube placement.  In addition he had worsening renal function during the course of the hospitalization and temporary left internal jugular dialysis catheter was placed and he was initiated on dialysis immediately before transfer here.   1.  Acute renal failure secondary to ATN. 2.  Acute respiratory failure. 3.  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 4.  Anemia unspecified. 5.  MRSA sepsis secondary to dialysis catheter. 6.  Hypernatremia.  Plan:  Renal function continues to deteriorate.  BUN up to 149 with a creatinine of 3.25.  He is still producing urine.  However as before azotemia could be influencing his mental status now.  Therefore we will reinitiate the patient on dialysis.  We will provide albumin support as previously did not tolerate dialysis very well.  Temporary dialysis catheter will need to be replaced.  Patient also still has mild hypernatremia with a serum sodium of 148.  Recommend adjusting free water input through the nutrition team.  Further plan as patient progresses.   lOS: 0 Randie Tallarico 5/29/20194:51 PM

## 2018-04-25 NOTE — Progress Notes (Signed)
Pulmonary Critical Care Medicine [facility]   PULMONARY SERVICE  PROGRESS NOTE  Date of Service: 04/25/2018  Curtis Hopkins  WGN:[id number]  DOB: [birth year]   DOA: 03/29/2018  Referring Physician: Carron Curie, MD  HPI: Curtis Hopkins is a 72 y.o. male seen for follow up of Acute on Chronic Respiratory Failure.  Afebrile right now has a size 4 tracheostomy in place tolerating it well.  Medications: Reviewed on Rounds  Physical Exam:  Vitals: Temperature 99.3 pulse 98 respiratory 18 blood pressure 139/60 saturations 97%  Ventilator Settings T collar trials on 28% FiO2  . General: Comfortable at this time . Eyes: Grossly normal lids, irises & conjunctiva . ENT: grossly tongue is normal . Neck: no obvious mass . Cardiovascular: S1 S2 normal no gallop . Respiratory: No rhonchi are noted . Abdomen: soft . Skin: no rash seen on limited exam . Musculoskeletal: not rigid . Psychiatric:unable to assess . Neurologic: no seizure no involuntary movements         Labs on Admission:  Basic Metabolic Panel: Recent Labs  Lab 04/19/18 0539 04/20/18 0645 04/21/18 0220 04/22/18 0523 04/23/18 0440 04/25/18 0814 04/25/18 0828  NA 144 141  --  136 142  --  148*  K 3.0* 3.3* 3.1* 3.4* 3.7  --  4.1  CL 112* 108  --  104 109  --  111  CO2 22 22  --  21* 23  --  23  GLUCOSE 164* 163*  --  159* 151*  --  152*  BUN 93* 99*  --  107* 119*  --  149*  CREATININE 2.83* 3.02*  --  2.92* 2.98*  --  3.25*  CALCIUM 8.0* 8.0*  --  8.1* 8.2*  --  8.4*  MG  --   --   --   --  2.3 2.7*  --   PHOS  --   --   --   --   --   --  5.2*    Liver Function Tests: Recent Labs  Lab 04/25/18 0828  ALBUMIN 1.8*   No results for input(s): LIPASE, AMYLASE in the last 168 hours. No results for input(s): AMMONIA in the last 168 hours.  CBC: Recent Labs  Lab 04/23/18 0440 04/25/18 0814  WBC 13.9* 13.0*  HGB 8.3* 8.4*  HCT 26.0* 27.7*  MCV 89.3 91.4  PLT 355 377     Cardiac Enzymes: No results for input(s): CKTOTAL, CKMB, CKMBINDEX, TROPONINI in the last 168 hours.  BNP (last 3 results) No results for input(s): BNP in the last 8760 hours.  ProBNP (last 3 results) No results for input(s): PROBNP in the last 8760 hours.  Radiological Exams on Admission: No results found.  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 Acute on chronic respiratory failure with hypoxia we will continue with weaning on T collar continue secretion management pulmonary toilet patient is basically unresponsive is at baseline 2. Chronic atrial fibrillation rate is controlled we will continue to monitor 3. Aspiration pneumonia treated with antibiotics we will continue to follow 4. Pulmonary embolism treated we will continue to follow. 5. Acute tubular necrosis continue present management 6. Severe sepsis with shock hemodynamically stable right  now   I have personally seen and evaluated the patient, evaluated laboratory and imaging results, formulated the assessment and plan and placed orders. The Patient requires high complexity decision making for assessment and support.  Case was discussed on Rounds with the Respiratory Therapy Staff  Yevonne Pax, MD [facility] Pulmonary Critical Care Medicine Sleep Medicine

## 2018-04-26 ENCOUNTER — Other Ambulatory Visit (HOSPITAL_COMMUNITY): Payer: Self-pay

## 2018-04-26 ENCOUNTER — Encounter (HOSPITAL_COMMUNITY): Payer: Self-pay | Admitting: Interventional Radiology

## 2018-04-26 DIAGNOSIS — J690 Pneumonitis due to inhalation of food and vomit: Secondary | ICD-10-CM | POA: Diagnosis not present

## 2018-04-26 DIAGNOSIS — J9621 Acute and chronic respiratory failure with hypoxia: Secondary | ICD-10-CM | POA: Diagnosis not present

## 2018-04-26 DIAGNOSIS — I482 Chronic atrial fibrillation: Secondary | ICD-10-CM | POA: Diagnosis not present

## 2018-04-26 DIAGNOSIS — N170 Acute kidney failure with tubular necrosis: Secondary | ICD-10-CM | POA: Diagnosis not present

## 2018-04-26 HISTORY — PX: IR FLUORO GUIDE CV LINE RIGHT: IMG2283

## 2018-04-26 HISTORY — PX: IR US GUIDE VASC ACCESS RIGHT: IMG2390

## 2018-04-26 LAB — RENAL FUNCTION PANEL
ALBUMIN: 1.8 g/dL — AB (ref 3.5–5.0)
ANION GAP: 10 (ref 5–15)
BUN: 142 mg/dL — AB (ref 6–20)
CALCIUM: 8.2 mg/dL — AB (ref 8.9–10.3)
CO2: 22 mmol/L (ref 22–32)
Chloride: 114 mmol/L — ABNORMAL HIGH (ref 101–111)
Creatinine, Ser: 3.16 mg/dL — ABNORMAL HIGH (ref 0.61–1.24)
GFR calc Af Amer: 21 mL/min — ABNORMAL LOW (ref >60)
GFR calc non Af Amer: 18 mL/min — ABNORMAL LOW (ref >60)
GLUCOSE: 171 mg/dL — AB (ref 65–99)
PHOSPHORUS: 4.4 mg/dL (ref 2.5–4.6)
Potassium: 4.1 mmol/L (ref 3.5–5.1)
SODIUM: 146 mmol/L — AB (ref 135–145)

## 2018-04-26 LAB — CULTURE, BLOOD (ROUTINE X 2)
Culture: NO GROWTH
Culture: NO GROWTH
Special Requests: ADEQUATE

## 2018-04-26 LAB — CBC
HCT: 26.6 % — ABNORMAL LOW (ref 39.0–52.0)
HEMOGLOBIN: 8.3 g/dL — AB (ref 13.0–17.0)
MCH: 28.8 pg (ref 26.0–34.0)
MCHC: 31.2 g/dL (ref 30.0–36.0)
MCV: 92.4 fL (ref 78.0–100.0)
Platelets: 395 10*3/uL (ref 150–400)
RBC: 2.88 MIL/uL — ABNORMAL LOW (ref 4.22–5.81)
RDW: 16.3 % — ABNORMAL HIGH (ref 11.5–15.5)
WBC: 13.5 10*3/uL — AB (ref 4.0–10.5)

## 2018-04-26 LAB — PROTIME-INR
INR: 2.08
Prothrombin Time: 23.2 seconds — ABNORMAL HIGH (ref 11.4–15.2)

## 2018-04-26 LAB — MAGNESIUM: MAGNESIUM: 2.6 mg/dL — AB (ref 1.7–2.4)

## 2018-04-26 MED ORDER — HEPARIN SODIUM (PORCINE) 1000 UNIT/ML IJ SOLN
INTRAMUSCULAR | Status: AC
  Administered 2018-04-26: 2.8 [IU]
  Filled 2018-04-26: qty 1

## 2018-04-26 MED ORDER — LIDOCAINE HCL 1 % IJ SOLN
INTRAMUSCULAR | Status: AC
  Filled 2018-04-26: qty 20

## 2018-04-26 NOTE — Procedures (Signed)
Interventional Radiology Procedure Note  Procedure: Placement of a right IJ Trialysis catheter. Tips in the RA and ready for use.   Complications: None  Estimated Blood Loss: None  Recommendations: Routine line care  Signed,  Sterling Big, MD

## 2018-04-26 NOTE — Progress Notes (Signed)
Pulmonary Critical Care Medicine [facility] [place]   PULMONARY SERVICE  PROGRESS NOTE  Date of Service: 04/26/2018  Curtis Hopkins  ZOX:[id number]  DOB: [birth year]   DOA: 04/21/2018  Referring Physician: Carron Curie, MD  HPI: Curtis Hopkins is a 72 y.o. male seen for follow up of Acute on Chronic Respiratory Failure.  Patient is comfortable without distress is on T collar has been on 28% FiO2.  Patient went down for hemodialysis catheter placement  Medications: Reviewed on Rounds  Physical Exam:  Vitals: Temperature 100.9 pulse 75 respiratory rate 21 blood pressure 150/70 saturation 96%  Ventilator Settings off of the ventilator on T collar  . General: Comfortable at this time . Eyes: Grossly normal lids, irises & conjunctiva . ENT: grossly tongue is normal . Neck: no obvious mass . Cardiovascular: S1 S2 normal no gallop . Respiratory: No rhonchi or rales expansion is equal . Abdomen: soft . Skin: no rash seen on limited exam . Musculoskeletal: not rigid . Psychiatric:unable to assess . Neurologic: no seizure no involuntary movements         Labs on Admission:  Basic Metabolic Panel: Recent Labs  Lab 04/20/18 0645 04/21/18 0220 04/22/18 0523 04/23/18 0440 04/25/18 0814 04/25/18 0828 04/26/18 0538  NA 141  --  136 142  --  148* 146*  K 3.3* 3.1* 3.4* 3.7  --  4.1 4.1  CL 108  --  104 109  --  111 114*  CO2 22  --  21* 23  --  23 22  GLUCOSE 163*  --  159* 151*  --  152* 171*  BUN 99*  --  107* 119*  --  149* 142*  CREATININE 3.02*  --  2.92* 2.98*  --  3.25* 3.16*  CALCIUM 8.0*  --  8.1* 8.2*  --  8.4* 8.2*  MG  --   --   --  2.3 2.7*  --  2.6*  PHOS  --   --   --   --   --  5.2* 4.4    Liver Function Tests: Recent Labs  Lab 04/25/18 0828 04/26/18 0538  ALBUMIN 1.8* 1.8*   No results for input(s): LIPASE, AMYLASE in the last 168 hours. No results for input(s): AMMONIA in the last 168 hours.  CBC: Recent Labs  Lab  04/23/18 0440 04/25/18 0814 04/26/18 0538  WBC 13.9* 13.0* 13.5*  HGB 8.3* 8.4* 8.3*  HCT 26.0* 27.7* 26.6*  MCV 89.3 91.4 92.4  PLT 355 377 395    Cardiac Enzymes: No results for input(s): CKTOTAL, CKMB, CKMBINDEX, TROPONINI in the last 168 hours.  BNP (last 3 results) No results for input(s): BNP in the last 8760 hours.  ProBNP (last 3 results) No results for input(s): PROBNP in the last 8760 hours.  Radiological Exams on Admission: Ir Fluoro Guide Cv Line Right  Result Date: 04/26/2018 INDICATION: 71 year old male with acute tubular necrosis in need of hemodialysis. He presents for placement of a temporary hemodialysis catheter EXAM: IR ULTRASOUND GUIDANCE VASC ACCESS RIGHT; IR RIGHT FLOURO GUIDE CV LINE MEDICATIONS: None ANESTHESIA/SEDATION: None FLUOROSCOPY TIME:  Fluoroscopy Time: 0 minutes 6 seconds (2 mGy). COMPLICATIONS: None immediate. PROCEDURE: Informed written consent was obtained from the patient after a thorough discussion of the procedural risks, benefits and alternatives. All questions were addressed. Maximal Sterile Barrier Technique was utilized including caps, mask, sterile gowns, sterile gloves, sterile drape, hand hygiene and skin antiseptic. A timeout was performed prior to the initiation of the  procedure. The right internal jugular vein was interrogated with ultrasound and found to be widely patent. An image was obtained and stored for the medical record. Local anesthesia was attained by infiltration with 1% lidocaine. A small dermatotomy was made. Under real-time sonographic guidance, the vessel was punctured with a 21 gauge micropuncture needle. Using standard technique, the initial micro needle was exchanged over a 0.018 micro wire for a transitional 4 [place] micro sheath. The micro sheath was then exchanged over a 0.035 wire for a dilator in the soft tissue tract was dilated. A 20 cm triple-lumen hemodialysis catheter was then advanced over the wire and position  with the tip in the upper right atrium. The catheter flushes and aspirates with ease. Catheter was flushed with heparinized saline and secured to the skin with 0 Prolene suture. The patient tolerated the procedure well. IMPRESSION: Successful placement of a right IJ approach temporary hemodialysis catheter. The catheter is ready for immediate use. Electronically Signed   By: Malachy Moan M.D.   On: 04/26/2018 14:11   Ir US Guide Vasc Access Right  Result Date: 04/26/2018 INDICATION: 72 year old male with acute tubular necrosis in need of hemodialysis. He presents for placement of a temporary hemodialysis catheter EXAM: IR ULTRASOUND GUIDANCE VASC ACCESS RIGHT; IR RIGHT FLOURO GUIDE CV LINE MEDICATIONS: None ANESTHESIA/SEDATION: None FLUOROSCOPY TIME:  Fluoroscopy Time: 0 minutes 6 seconds (2 mGy). COMPLICATIONS: None immediate. PROCEDURE: Informed written consent was obtained from the patient after a thorough discussion of the procedural risks, benefits and alternatives. All questions were addressed. Maximal Sterile Barrier Technique was utilized including caps, mask, sterile gowns, sterile gloves, sterile drape, hand hygiene and skin antiseptic. A timeout was performed prior to the initiation of the procedure. The right internal jugular vein was interrogated with ultrasound and found to be widely patent. An image was obtained and stored for the medical record. Local anesthesia was attained by infiltration with 1% lidocaine. A small dermatotomy was made. Under real-time sonographic guidance, the vessel was punctured with a 21 gauge micropuncture needle. Using standard technique, the initial micro needle was exchanged over a 0.018 micro wire for a transitional 4 [place] micro sheath. The micro sheath was then exchanged over a 0.035 wire for a dilator in the soft tissue tract was dilated. A 20 cm triple-lumen hemodialysis catheter was then advanced over the wire and position with the tip in the upper right  atrium. The catheter flushes and aspirates with ease. Catheter was flushed with heparinized saline and secured to the skin with 0 Prolene suture. The patient tolerated the procedure well. IMPRESSION: Successful placement of a right IJ approach temporary hemodialysis catheter. The catheter is ready for immediate use. Electronically Signed   By: Malachy Moan M.D.   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 Acute on chronic respiratory failure with hypoxia we will continue with T collar trials.  Does have a fever this will be worked up. 2. Acute renal failure followed by nephrology for dialysis.  Had a catheter placed. 3. Pneumonia due to aspiration stable we will continue to monitor has fevers will monitor closely. 4. Severe sepsis with shock hemodynamically stable 5. Pulmonary embolism at baseline 6.  Chronic atrial fibrillation rate is controlled   I have personally seen and evaluated the patient, evaluated laboratory and imaging results, formulated the assessment and plan and placed orders. The Patient requires high complexity decision making for assessment and support.  Case was discussed on Rounds with the Respiratory Therapy Staff  Yevonne Pax, MD [facility] Pulmonary Critical Care Medicine Sleep Medicine

## 2018-04-27 ENCOUNTER — Other Ambulatory Visit (HOSPITAL_COMMUNITY): Payer: Self-pay

## 2018-04-27 DIAGNOSIS — J9621 Acute and chronic respiratory failure with hypoxia: Secondary | ICD-10-CM | POA: Diagnosis not present

## 2018-04-27 DIAGNOSIS — N170 Acute kidney failure with tubular necrosis: Secondary | ICD-10-CM | POA: Diagnosis not present

## 2018-04-27 DIAGNOSIS — I482 Chronic atrial fibrillation: Secondary | ICD-10-CM | POA: Diagnosis not present

## 2018-04-27 DIAGNOSIS — J690 Pneumonitis due to inhalation of food and vomit: Secondary | ICD-10-CM | POA: Diagnosis not present

## 2018-04-27 LAB — CBC
HEMATOCRIT: 28.3 % — AB (ref 39.0–52.0)
HEMOGLOBIN: 8.7 g/dL — AB (ref 13.0–17.0)
MCH: 28.3 pg (ref 26.0–34.0)
MCHC: 30.7 g/dL (ref 30.0–36.0)
MCV: 92.2 fL (ref 78.0–100.0)
Platelets: 288 10*3/uL (ref 150–400)
RBC: 3.07 MIL/uL — ABNORMAL LOW (ref 4.22–5.81)
RDW: 16.2 % — ABNORMAL HIGH (ref 11.5–15.5)
WBC: 16.9 10*3/uL — ABNORMAL HIGH (ref 4.0–10.5)

## 2018-04-27 LAB — RENAL FUNCTION PANEL
ALBUMIN: 2.1 g/dL — AB (ref 3.5–5.0)
ANION GAP: 10 (ref 5–15)
BUN: 121 mg/dL — ABNORMAL HIGH (ref 6–20)
CALCIUM: 8 mg/dL — AB (ref 8.9–10.3)
CO2: 25 mmol/L (ref 22–32)
Chloride: 107 mmol/L (ref 101–111)
Creatinine, Ser: 3.36 mg/dL — ABNORMAL HIGH (ref 0.61–1.24)
GFR calc Af Amer: 20 mL/min — ABNORMAL LOW (ref >60)
GFR, EST NON AFRICAN AMERICAN: 17 mL/min — AB (ref >60)
GLUCOSE: 155 mg/dL — AB (ref 65–99)
PHOSPHORUS: 4.4 mg/dL (ref 2.5–4.6)
POTASSIUM: 3.9 mmol/L (ref 3.5–5.1)
SODIUM: 142 mmol/L (ref 135–145)

## 2018-04-27 LAB — PROTIME-INR
INR: 2.2
Prothrombin Time: 24.3 seconds — ABNORMAL HIGH (ref 11.4–15.2)

## 2018-04-27 LAB — MAGNESIUM: Magnesium: 2.3 mg/dL (ref 1.7–2.4)

## 2018-04-27 NOTE — Progress Notes (Signed)
[name]  ROUNDING NOTE   Subjective:  Patient seen at bedside. Wife is also at the bedside. He was reinitiated on dialysis yesterday. No significant change in mental status.   Objective:  Vital signs in last 24 hours:  Temperature 99.6 pulse 81 respirations 12 blood pressure 128/66  Physical Exam: General: No acute distress  Head: Normocephalic, atraumatic. Moist oral mucosal membranes  Eyes: Eyes closed  Neck: Tracheostomy present  Lungs:  Scattered rhonchi, normal effort  Heart: S1S2 no rubs  Abdomen:  Soft, nontender, bowel sounds present, PEG present  Extremities: no peripheral edema.  Neurologic: Currently somnolent, does not open eyes  Skin: No lesions  Access: Right IJ temporary dialysis catheter placed 04/26/18    Basic Metabolic Panel: Recent Labs  Lab 04/22/18 0523 04/23/18 0440 04/25/18 0814 04/25/18 0828 04/26/18 0538 04/27/18 0900  NA 136 142  --  148* 146* 142  K 3.4* 3.7  --  4.1 4.1 3.9  CL 104 109  --  111 114* 107  CO2 21* 23  --  23 22 25   GLUCOSE 159* 151*  --  152* 171* 155*  BUN 107* 119*  --  149* 142* 121*  CREATININE 2.92* 2.98*  --  3.25* 3.16* 3.36*  CALCIUM 8.1* 8.2*  --  8.4* 8.2* 8.0*  MG  --  2.3 2.7*  --  2.6* 2.3  PHOS  --   --   --  5.2* 4.4 4.4    Liver Function Tests: Recent Labs  Lab 04/25/18 0828 04/26/18 0538 04/27/18 0900  ALBUMIN 1.8* 1.8* 2.1*   No results for input(s): LIPASE, AMYLASE in the last 168 hours. No results for input(s): AMMONIA in the last 168 hours.  CBC: Recent Labs  Lab 04/23/18 0440 04/25/18 0814 04/26/18 0538 04/27/18 0900  WBC 13.9* 13.0* 13.5* 16.9*  HGB 8.3* 8.4* 8.3* 8.7*  HCT 26.0* 27.7* 26.6* 28.3*  MCV 89.3 91.4 92.4 92.2  PLT 355 377 395 288    Cardiac Enzymes: No results for input(s): CKTOTAL, CKMB, CKMBINDEX, TROPONINI in the last 168 hours.  BNP: Invalid input(s): POCBNP  CBG: No results for input(s): GLUCAP in the last 168  hours.  Microbiology: Results for orders placed or performed during the hospital encounter of 04/03/2018  C difficile quick scan w PCR reflex     Status: None   Collection Time: 04/20/2018  3:01 PM  Result Value Ref Range Status   C Diff antigen NEGATIVE NEGATIVE Final   C Diff toxin NEGATIVE NEGATIVE Final   C Diff interpretation No C. difficile detected.  Final    Comment: Performed at [facility], [address], [place], [place] [address]  Culture, Urine     Status: None   Collection Time: 03/29/18 10:45 PM  Result Value Ref Range Status   Specimen Description URINE, RANDOM  Final   Special Requests NONE  Final   Culture   Final    NO GROWTH Performed at [facility], [address], [place], [place] [address]    Report Status 03/31/2018 FINAL  Final  Culture, respiratory (NON-Expectorated)     Status: None   Collection Time: 03/30/18  8:55 AM  Result Value Ref Range Status   Specimen Description TRACHEAL ASPIRATE  Final   Special Requests NONE  Final   Gram Stain   Final    MODERATE WBC PRESENT, PREDOMINANTLY PMN MODERATE SQUAMOUS EPITHELIAL CELLS PRESENT FEW GRAM POSITIVE COCCI FEW GRAM POSITIVE RODS  Culture   Final    Consistent with normal respiratory flora. Performed at [facility], [address] [place], [place] [address]    Report Status 04/01/2018 FINAL  Final  Culture, respiratory (NON-Expectorated)     Status: None   Collection Time: 04/05/18  4:14 PM  Result Value Ref Range Status   Specimen Description TRACHEAL ASPIRATE  Final   Special Requests NONE  Final   Gram Stain   Final    ABUNDANT WBC PRESENT, PREDOMINANTLY PMN MODERATE GRAM POSITIVE COCCI MODERATE GRAM VARIABLE ROD    Culture   Final    MODERATE Consistent with normal respiratory flora. Performed at [facility], [address] [place], [place] [address]    Report Status 04/07/2018 FINAL  Final  Culture, Urine     Status: None   Collection Time: 04/05/18  4:45  PM  Result Value Ref Range Status   Specimen Description URINE, RANDOM  Final   Special Requests NONE  Final   Culture   Final    NO GROWTH Performed at [facility], [address], [place], [place] [address]    Report Status 04/06/2018 FINAL  Final  Culture, blood (routine x 2)     Status: None   Collection Time: 04/06/18  3:19 PM  Result Value Ref Range Status   Specimen Description BLOOD RIGHT HAND  Final   Special Requests   Final    BOTTLES DRAWN AEROBIC ONLY Blood Culture adequate volume   Culture   Final    NO GROWTH 5 DAYS Performed at [facility], [address], [place], [place] [address]    Report Status 04/11/2018 FINAL  Final  Culture, blood (routine x 2)     Status: Abnormal   Collection Time: 04/06/18  3:28 PM  Result Value Ref Range Status   Specimen Description BLOOD LEFT HAND  Final   Special Requests   Final    BOTTLES DRAWN AEROBIC AND ANAEROBIC Blood Culture adequate volume   Culture  Setup Time   Final    GRAM POSITIVE COCCI IN CLUSTERS IN BOTH AEROBIC AND ANAEROBIC BOTTLES GRAM POSITIVE RODS AEROBIC BOTTLE ONLY CRITICAL RESULT CALLED TO, READ BACK BY AND VERIFIED WITH: Jannett Celestine [id number] 04/07/2018 T. TYSOR    Culture (A)  Final    BACILLUS SPECIES Standardized susceptibility testing for this organism is not available. STAPHYLOCOCCUS SPECIES (COAGULASE NEGATIVE) SUSCEPTIBILITIES PERFORMED ON PREVIOUS CULTURE WITHIN THE LAST 5 DAYS. Performed at [facility], [address] [place], [place] [address]    Report Status 04/14/2018 FINAL  Final  Blood Culture ID Panel (Reflexed)     Status: Abnormal   Collection Time: 04/06/18  3:28 PM  Result Value Ref Range Status   Enterococcus species NOT DETECTED NOT DETECTED Final   Listeria monocytogenes NOT DETECTED NOT DETECTED Final   Staphylococcus species DETECTED (A) NOT DETECTED Final    Comment: Methicillin (oxacillin) resistant coagulase negative staphylococcus. Possible blood  culture contaminant (unless isolated from more than one blood culture draw or clinical case suggests pathogenicity). No antibiotic treatment is indicated for blood  culture contaminants. CRITICAL RESULT CALLED TO, READ BACK BY AND VERIFIED WITH: N. OLUBODUN,RN [id number] 04/07/2018 T. TYSOR    Staphylococcus aureus NOT DETECTED NOT DETECTED Final   Methicillin resistance DETECTED (A) NOT DETECTED Final    Comment: CRITICAL RESULT CALLED TO, READ BACK BY AND VERIFIED WITH: Jannett Celestine [id number] 04/07/2018 T. Hedwig Morton  Streptococcus species NOT DETECTED NOT DETECTED Final   Streptococcus agalactiae NOT DETECTED NOT DETECTED Final   Streptococcus pneumoniae NOT DETECTED NOT DETECTED Final   Streptococcus pyogenes NOT DETECTED NOT DETECTED Final   Acinetobacter baumannii NOT DETECTED NOT DETECTED Final   Enterobacteriaceae species NOT DETECTED NOT DETECTED Final   Enterobacter cloacae complex NOT DETECTED NOT DETECTED Final   Escherichia coli NOT DETECTED NOT DETECTED Final   Klebsiella oxytoca NOT DETECTED NOT DETECTED Final   Klebsiella pneumoniae NOT DETECTED NOT DETECTED Final   Proteus species NOT DETECTED NOT DETECTED Final   Serratia marcescens NOT DETECTED NOT DETECTED Final   Haemophilus influenzae NOT DETECTED NOT DETECTED Final   Neisseria meningitidis NOT DETECTED NOT DETECTED Final   Pseudomonas aeruginosa NOT DETECTED NOT DETECTED Final   Candida albicans NOT DETECTED NOT DETECTED Final   Candida glabrata NOT DETECTED NOT DETECTED Final   Candida krusei NOT DETECTED NOT DETECTED Final   Candida parapsilosis NOT DETECTED NOT DETECTED Final   Candida tropicalis NOT DETECTED NOT DETECTED Final  Culture, blood (routine x 2)     Status: Abnormal   Collection Time: 04/10/18 10:35 AM  Result Value Ref Range Status   Specimen Description BLOOD RIGHT HAND  Final   Special Requests   Final    BOTTLES DRAWN AEROBIC ONLY Blood Culture results may not be optimal due to an inadequate volume of  blood received in culture bottles   Culture  Setup Time   Final    AEROBIC BOTTLE ONLY GRAM POSITIVE COCCI CRITICAL VALUE NOTED.  VALUE IS CONSISTENT WITH PREVIOUSLY REPORTED AND CALLED VALUE. Performed at [facility], [address], [place], [place] [address]    Culture STAPHYLOCOCCUS SPECIES (COAGULASE NEGATIVE) (A)  Final   Report Status 04/14/2018 FINAL  Final   Organism ID, Bacteria STAPHYLOCOCCUS SPECIES (COAGULASE NEGATIVE)  Final      Susceptibility   Staphylococcus species (coagulase negative) - MIC*    CIPROFLOXACIN >=8 RESISTANT Resistant     ERYTHROMYCIN >=8 RESISTANT Resistant     GENTAMICIN 8 INTERMEDIATE Intermediate     OXACILLIN >=4 RESISTANT Resistant     TETRACYCLINE 2 SENSITIVE Sensitive     VANCOMYCIN 2 SENSITIVE Sensitive     TRIMETH/SULFA 80 RESISTANT Resistant     CLINDAMYCIN >=8 RESISTANT Resistant     RIFAMPIN <=0.5 SENSITIVE Sensitive     Inducible Clindamycin NEGATIVE Sensitive     * STAPHYLOCOCCUS SPECIES (COAGULASE NEGATIVE)  Culture, blood (routine x 2)     Status: None   Collection Time: 04/10/18 10:42 AM  Result Value Ref Range Status   Specimen Description BLOOD RIGHT ARM  Final   Special Requests   Final    BOTTLES DRAWN AEROBIC ONLY Blood Culture results may not be optimal due to an inadequate volume of blood received in culture bottles   Culture   Final    NO GROWTH 5 DAYS Performed at [facility], [address] [place], [place] [address]    Report Status 04/15/2018 FINAL  Final  Culture, respiratory (NON-Expectorated)     Status: None   Collection Time: 04/10/18  3:15 PM  Result Value Ref Range Status   Specimen Description TRACHEAL ASPIRATE  Final   Special Requests NONE  Final   Gram Stain   Final    ABUNDANT WBC PRESENT,BOTH PMN AND MONONUCLEAR FEW GRAM POSITIVE COCCI MODERATE GRAM VARIABLE ROD    Culture   Final    Consistent with normal respiratory  flora. Performed at [facility], [address],  [place], [place] [address]    Report Status 04/12/2018 FINAL  Final  Culture, Urine     Status: Abnormal   Collection Time: 04/10/18  4:15 PM  Result Value Ref Range Status   Specimen Description URINE, RANDOM  Final   Special Requests   Final    NONE Performed at [facility], [address], [place], [place] [address]    Culture MULTIPLE SPECIES PRESENT, SUGGEST RECOLLECTION (A)  Final   Report Status 04/11/2018 FINAL  Final  Culture, Urine     Status: None   Collection Time: 04/13/18  6:00 PM  Result Value Ref Range Status   Specimen Description URINE, RANDOM  Final   Special Requests NONE  Final   Culture   Final    NO GROWTH Performed at [facility], [address], [place], [place] [address]    Report Status 04/15/2018 FINAL  Final  Culture, blood (routine x 2)     Status: Abnormal   Collection Time: 04/15/18  2:15 PM  Result Value Ref Range Status   Specimen Description BLOOD LEFT HAND  Final   Special Requests   Final    BOTTLES DRAWN AEROBIC AND ANAEROBIC Blood Culture adequate volume   Culture  Setup Time   Final    GRAM POSITIVE COCCI ANAEROBIC BOTTLE ONLY CRITICAL VALUE NOTED.  VALUE IS CONSISTENT WITH PREVIOUSLY REPORTED AND CALLED VALUE.    Culture (A)  Final    STAPHYLOCOCCUS SPECIES (COAGULASE NEGATIVE) SUSCEPTIBILITIES PERFORMED ON PREVIOUS CULTURE WITHIN THE LAST 5 DAYS. Performed at [facility], [address], [place], [place] [address]    Report Status 04/18/2018 FINAL  Final  Culture, blood (routine x 2)     Status: Abnormal   Collection Time: 04/15/18  2:25 PM  Result Value Ref Range Status   Specimen Description BLOOD RIGHT HAND  Final   Special Requests   Final    BOTTLES DRAWN AEROBIC AND ANAEROBIC Blood Culture adequate volume   Culture  Setup Time   Final    GRAM POSITIVE COCCI ANAEROBIC BOTTLE ONLY CRITICAL VALUE NOTED.  VALUE IS CONSISTENT WITH PREVIOUSLY REPORTED AND CALLED VALUE.    Culture (A)  Final    STAPHYLOCOCCUS  SPECIES (COAGULASE NEGATIVE) SUSCEPTIBILITIES PERFORMED ON PREVIOUS CULTURE WITHIN THE LAST 5 DAYS. Performed at [facility], [address] [place], [place] [address]    Report Status 04/18/2018 FINAL  Final  Culture, respiratory (NON-Expectorated)     Status: None   Collection Time: 04/17/18  1:45 PM  Result Value Ref Range Status   Specimen Description TRACHEAL ASPIRATE  Final   Special Requests NONE  Final   Gram Stain   Final    FEW WBC PRESENT,BOTH PMN AND MONONUCLEAR FEW GRAM POSITIVE COCCI ABUNDANT GRAM VARIABLE ROD FEW YEAST MODERATE SQUAMOUS EPITHELIAL CELLS PRESENT    Culture   Final    MODERATE Consistent with normal respiratory flora. Performed at [facility], [address], [place], [place] [address]    Report Status 04/20/2018 FINAL  Final  Culture, blood (routine x 2)     Status: None   Collection Time: 04/21/18  9:39 AM  Result Value Ref Range Status   Specimen Description BLOOD LEFT FOREARM  Final   Special Requests   Final    BOTTLES DRAWN AEROBIC AND ANAEROBIC Blood Culture adequate volume   Culture  Final    NO GROWTH 5 DAYS Performed at [facility], [address], [place], [place] [address]    Report Status 04/26/2018 FINAL  Final  Culture, blood (routine x 2)     Status: None   Collection Time: 04/21/18 11:24 AM  Result Value Ref Range Status   Specimen Description BLOOD LEFT HAND  Final   Special Requests   Final    BOTTLES DRAWN AEROBIC ONLY Blood Culture results may not be optimal due to an inadequate volume of blood received in culture bottles   Culture   Final    NO GROWTH 5 DAYS Performed at [facility], [address], [place], [place] [address]    Report Status 04/26/2018 FINAL  Final    Coagulation Studies: Recent Labs    04/25/18 0814 04/26/18 0538 04/27/18 0900  LABPROT 21.8* 23.2* 24.3*  INR 1.92 2.08 2.20    Urinalysis: No results for input(s): COLORURINE, LABSPEC, PHURINE, GLUCOSEU, HGBUR,  BILIRUBINUR, KETONESUR, PROTEINUR, UROBILINOGEN, NITRITE, LEUKOCYTESUR in the last 72 hours.  Invalid input(s): APPERANCEUR    Imaging: Ir Fluoro Guide Cv Line Right  Result Date: 04/26/2018 INDICATION: 72 year old male with acute tubular necrosis in need of hemodialysis. He presents for placement of a temporary hemodialysis catheter EXAM: IR ULTRASOUND GUIDANCE VASC ACCESS RIGHT; IR RIGHT FLOURO GUIDE CV LINE MEDICATIONS: None ANESTHESIA/SEDATION: None FLUOROSCOPY TIME:  Fluoroscopy Time: 0 minutes 6 seconds (2 mGy). COMPLICATIONS: None immediate. PROCEDURE: Informed written consent was obtained from the patient after a thorough discussion of the procedural risks, benefits and alternatives. All questions were addressed. Maximal Sterile Barrier Technique was utilized including caps, mask, sterile gowns, sterile gloves, sterile drape, hand hygiene and skin antiseptic. A timeout was performed prior to the initiation of the procedure. The right internal jugular vein was interrogated with ultrasound and found to be widely patent. An image was obtained and stored for the medical record. Local anesthesia was attained by infiltration with 1% lidocaine. A small dermatotomy was made. Under real-time sonographic guidance, the vessel was punctured with a 21 gauge micropuncture needle. Using standard technique, the initial micro needle was exchanged over a 0.018 micro wire for a transitional 4 [place] micro sheath. The micro sheath was then exchanged over a 0.035 wire for a dilator in the soft tissue tract was dilated. A 20 cm triple-lumen hemodialysis catheter was then advanced over the wire and position with the tip in the upper right atrium. The catheter flushes and aspirates with ease. Catheter was flushed with heparinized saline and secured to the skin with 0 Prolene suture. The patient tolerated the procedure well. IMPRESSION: Successful placement of a right IJ approach temporary hemodialysis catheter. The catheter  is ready for immediate use. Electronically Signed   By: Malachy Moan M.D.   On: 04/26/2018 14:11   Ir US Guide Vasc Access Right  Result Date: 04/26/2018 INDICATION: 72 year old male with acute tubular necrosis in need of hemodialysis. He presents for placement of a temporary hemodialysis catheter EXAM: IR ULTRASOUND GUIDANCE VASC ACCESS RIGHT; IR RIGHT FLOURO GUIDE CV LINE MEDICATIONS: None ANESTHESIA/SEDATION: None FLUOROSCOPY TIME:  Fluoroscopy Time: 0 minutes 6 seconds (2 mGy). COMPLICATIONS: None immediate. PROCEDURE: Informed written consent was obtained from the patient after a thorough discussion of the procedural risks, benefits and alternatives. All questions were addressed. Maximal Sterile Barrier Technique was utilized including caps, mask, sterile gowns, sterile gloves, sterile drape, hand hygiene and skin antiseptic. A timeout was performed prior to the initiation of the  procedure. The right internal jugular vein was interrogated with ultrasound and found to be widely patent. An image was obtained and stored for the medical record. Local anesthesia was attained by infiltration with 1% lidocaine. A small dermatotomy was made. Under real-time sonographic guidance, the vessel was punctured with a 21 gauge micropuncture needle. Using standard technique, the initial micro needle was exchanged over a 0.018 micro wire for a transitional 4 [place] micro sheath. The micro sheath was then exchanged over a 0.035 wire for a dilator in the soft tissue tract was dilated. A 20 cm triple-lumen hemodialysis catheter was then advanced over the wire and position with the tip in the upper right atrium. The catheter flushes and aspirates with ease. Catheter was flushed with heparinized saline and secured to the skin with 0 Prolene suture. The patient tolerated the procedure well. IMPRESSION: Successful placement of a right IJ approach temporary hemodialysis catheter. The catheter is ready for immediate use.  Electronically Signed   By: Malachy MoanHeath  McCullough M.D.   On: 04/26/2018 14:11   Dg Abd Portable 1v  Result Date: 04/27/2018 CLINICAL DATA:  Evaluate PICC position EXAM: PORTABLE ABDOMEN - 1 VIEW COMPARISON:  04/25/2018 FINDINGS: A percutaneous gastrostomy tube projects over the left para median epigastrium. No contrast was injected. Moderate stool volume. Nonobstructive bowel gas pattern. No concerning mass effect or gas collection. IMPRESSION: Peg tubing projects over the epigastrium. Contrast could be injected if there is concern for extraluminal position. Electronically Signed   By: Marnee SpringJonathon  Watts M.D.   On: 04/27/2018 10:12     Medications:       Assessment/ Plan:  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  Patient initially presented outside hospital with facial droop as well as dysarthria.  During prior hospitalization he also developed acute respiratory failure and is status post tracheostomy placement as well as PEG tube placement.  In addition he had worsening renal function during the course of the hospitalization and temporary left internal jugular dialysis catheter was placed and he was initiated on dialysis immediately before transfer here.   1.  Acute renal failure secondary to ATN. 2.  Acute respiratory failure. 3.  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 4.  Anemia unspecified. 5.  MRSA sepsis secondary to dialysis catheter. 6.  Hypernatremia.  Plan:  Right internal jugular temporary dialysis catheter was replaced yesterday.  He was reinitiated on dialysis thereafter.  Azotemia has partially improved.  We will plan for additional dialysis treatment tomorrow.  Patient has not recovered from CVA as well as hold.  Head CT to be performed today.  Hemoglobin  appears to be stable at 8.7.  Hypernatremia has also corrected as sodium is down to 142.  Overall patient has very guarded prognosis.   lOS: 0 Antwine Agosto 5/31/20193:47 PM

## 2018-04-27 NOTE — Progress Notes (Signed)
Pulmonary Critical Care Medicine [facility] [place]   PULMONARY SERVICE  PROGRESS NOTE  Date of Service: 04/27/2018  BERNICE MULLIN  ZOX:[id number]  DOB: Nov 21, 1946   DOA: 04/09/2018  Referring Physician: Carron Curie, MD  HPI: Curtis Hopkins is a 72 y.o. male seen for follow up of Acute on Chronic Respiratory Failure.  Remains on T collar no distress at this time.  Patient is on 20% oxygen good saturations are noted.  Medications: Reviewed on Rounds  Physical Exam:  Vitals: Temperature 100.1 pulse 85 respiratory rate 20 blood pressure 124/61 saturations 98%  Ventilator Settings on T collar trials  . General: Comfortable at this time . Eyes: Grossly normal lids, irises & conjunctiva . ENT: grossly tongue is normal . Neck: no obvious mass . Cardiovascular: S1 S2 normal no gallop . Respiratory: No rhonchi rales . Abdomen: soft . Skin: no rash seen on limited exam . Musculoskeletal: not rigid . Psychiatric:unable to assess . Neurologic: no seizure no involuntary movements         Labs on Admission:  Basic Metabolic Panel: Recent Labs  Lab 04/22/18 0523 04/23/18 0440 04/25/18 0814 04/25/18 0828 04/26/18 0538 04/27/18 0900  NA 136 142  --  148* 146* 142  K 3.4* 3.7  --  4.1 4.1 3.9  CL 104 109  --  111 114* 107  CO2 21* 23  --  GLUCOSE 159* 151*  --  152* 171* 155*  BUN 107* 119*  --  149* 142* 121*  CREATININE 2.92* 2.98*  --  3.25* 3.16* 3.36*  CALCIUM 8.1* 8.2*  --  8.4* 8.2* 8.0*  MG  --  2.3 2.7*  --  2.6* 2.3  PHOS  --   --   --  5.2* 4.4 4.4    Liver Function Tests: Recent Labs  Lab 04/25/18 0828 04/26/18 0538 04/27/18 0900  ALBUMIN 1.8* 1.8* 2.1*   No results for input(s): LIPASE, AMYLASE in the last 168 hours. No results for input(s): AMMONIA in the last 168 hours.  CBC: Recent Labs  Lab 04/23/18 0440 04/25/18 0814 04/26/18 0538 04/27/18 0900  WBC 13.9* 13.0* 13.5* 16.9*  HGB 8.3* 8.4* 8.3* 8.7*  HCT 26.0*  27.7* 26.6* 28.3*  MCV 89.3 91.4 92.4 92.2  PLT 355 377 395 288    Cardiac Enzymes: No results for input(s): CKTOTAL, CKMB, CKMBINDEX, TROPONINI in the last 168 hours.  BNP (last 3 results) No results for input(s): BNP in the last 8760 hours.  ProBNP (last 3 results) No results for input(s): PROBNP in the last 8760 hours.  Radiological Exams on Admission: Ir Fluoro Guide Cv Line Right  Result Date: 04/26/2018 INDICATION: 72 year old male with acute tubular necrosis in need of hemodialysis. He presents for placement of a temporary hemodialysis catheter EXAM: IR ULTRASOUND GUIDANCE VASC ACCESS RIGHT; IR RIGHT FLOURO GUIDE CV LINE MEDICATIONS: None ANESTHESIA/SEDATION: None FLUOROSCOPY TIME:  Fluoroscopy Time: 0 minutes 6 seconds (2 mGy). COMPLICATIONS: None immediate. PROCEDURE: Informed written consent was obtained from the patient after a thorough discussion of the procedural risks, benefits and alternatives. All questions were addressed. Maximal Sterile Barrier Technique was utilized including caps, mask, sterile gowns, sterile gloves, sterile drape, hand hygiene and skin antiseptic. A timeout was performed prior to the initiation of the procedure. The right internal jugular vein was interrogated with ultrasound and found to be widely patent. An image was obtained and stored for the medical record. Local anesthesia was attained by infiltration with 1%  lidocaine. A small dermatotomy was made. Under real-time sonographic guidance, the vessel was punctured with a 21 gauge micropuncture needle. Using standard technique, the initial micro needle was exchanged over a 0.018 micro wire for a transitional 4 [place] micro sheath. The micro sheath was then exchanged over a 0.035 wire for a dilator in the soft tissue tract was dilated. A 20 cm triple-lumen hemodialysis catheter was then advanced over the wire and position with the tip in the upper right atrium. The catheter flushes and aspirates with ease.  Catheter was flushed with heparinized saline and secured to the skin with 0 Prolene suture. The patient tolerated the procedure well. IMPRESSION: Successful placement of a right IJ approach temporary hemodialysis catheter. The catheter is ready for immediate use. Electronically Signed   By: Malachy Moan M.D.   On: 04/26/2018 14:11   Ir US Guide Vasc Access Right  Result Date: 04/26/2018 INDICATION: 72 year old male with acute tubular necrosis in need of hemodialysis. He presents for placement of a temporary hemodialysis catheter EXAM: IR ULTRASOUND GUIDANCE VASC ACCESS RIGHT; IR RIGHT FLOURO GUIDE CV LINE MEDICATIONS: None ANESTHESIA/SEDATION: None FLUOROSCOPY TIME:  Fluoroscopy Time: 0 minutes 6 seconds (2 mGy). COMPLICATIONS: None immediate. PROCEDURE: Informed written consent was obtained from the patient after a thorough discussion of the procedural risks, benefits and alternatives. All questions were addressed. Maximal Sterile Barrier Technique was utilized including caps, mask, sterile gowns, sterile gloves, sterile drape, hand hygiene and skin antiseptic. A timeout was performed prior to the initiation of the procedure. The right internal jugular vein was interrogated with ultrasound and found to be widely patent. An image was obtained and stored for the medical record. Local anesthesia was attained by infiltration with 1% lidocaine. A small dermatotomy was made. Under real-time sonographic guidance, the vessel was punctured with a 21 gauge micropuncture needle. Using standard technique, the initial micro needle was exchanged over a 0.018 micro wire for a transitional 4 [place] micro sheath. The micro sheath was then exchanged over a 0.035 wire for a dilator in the soft tissue tract was dilated. A 20 cm triple-lumen hemodialysis catheter was then advanced over the wire and position with the tip in the upper right atrium. The catheter flushes and aspirates with ease. Catheter was flushed with  heparinized saline and secured to the skin with 0 Prolene suture. The patient tolerated the procedure well. IMPRESSION: Successful placement of a right IJ approach temporary hemodialysis catheter. The catheter is ready for immediate use. Electronically Signed   By: Malachy Moan M.D.   On: 04/26/2018 14:11   Dg Abd Portable 1v  Result Date: 04/27/2018 CLINICAL DATA:  Evaluate PICC position EXAM: PORTABLE ABDOMEN - 1 VIEW COMPARISON:  04/17/2018 FINDINGS: A percutaneous gastrostomy tube projects over the left para median epigastrium. No contrast was injected. Moderate stool volume. Nonobstructive bowel gas pattern. No concerning mass effect or gas collection. IMPRESSION: Peg tubing projects over the epigastrium. Contrast could be injected if there is concern for extraluminal position. Electronically Signed   By: Marnee Spring M.D.   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 Acute on chronic respiratory failure with hypoxia we will continue with T collar trials.  Continue pulmonary toilet supportive care. 2. Chronic atrial fibrillation rate is controlled we will continue to follow. 3. Stroke due to basilar artery occlusion stable we will continue to monitor 4. Pulmonary embolism anticoagulation as tolerated 5. Severe sepsis with shock hemodynamically stable 6. Acute tubular necrosis with renal failure followed by nephrology 7. Pneumonia due to aspiration treated we will continue to monitor   I have personally seen and evaluated the patient, evaluated laboratory and imaging results, formulated the assessment and plan and placed orders. The Patient requires high complexity decision making for  assessment and support.  Case was discussed on Rounds with the Respiratory Therapy Staff  Yevonne Pax, MD [facility] Pulmonary Critical Care Medicine Sleep Medicine

## 2018-04-28 ENCOUNTER — Other Ambulatory Visit (HOSPITAL_COMMUNITY): Payer: Self-pay

## 2018-04-28 DIAGNOSIS — N170 Acute kidney failure with tubular necrosis: Secondary | ICD-10-CM | POA: Diagnosis not present

## 2018-04-28 DIAGNOSIS — J9621 Acute and chronic respiratory failure with hypoxia: Secondary | ICD-10-CM | POA: Diagnosis not present

## 2018-04-28 DIAGNOSIS — J690 Pneumonitis due to inhalation of food and vomit: Secondary | ICD-10-CM | POA: Diagnosis not present

## 2018-04-28 DIAGNOSIS — I482 Chronic atrial fibrillation: Secondary | ICD-10-CM | POA: Diagnosis not present

## 2018-04-28 LAB — RENAL FUNCTION PANEL
ALBUMIN: 1.7 g/dL — AB (ref 3.5–5.0)
ANION GAP: 11 (ref 5–15)
BUN: 129 mg/dL — ABNORMAL HIGH (ref 6–20)
CALCIUM: 7.8 mg/dL — AB (ref 8.9–10.3)
CO2: 24 mmol/L (ref 22–32)
Chloride: 105 mmol/L (ref 101–111)
Creatinine, Ser: 3.64 mg/dL — ABNORMAL HIGH (ref 0.61–1.24)
GFR calc non Af Amer: 16 mL/min — ABNORMAL LOW (ref >60)
GFR, EST AFRICAN AMERICAN: 18 mL/min — AB (ref >60)
GLUCOSE: 143 mg/dL — AB (ref 65–99)
PHOSPHORUS: 5.7 mg/dL — AB (ref 2.5–4.6)
POTASSIUM: 3.6 mmol/L (ref 3.5–5.1)
SODIUM: 140 mmol/L (ref 135–145)

## 2018-04-28 LAB — CBC
HEMATOCRIT: 23.1 % — AB (ref 39.0–52.0)
HEMOGLOBIN: 7.2 g/dL — AB (ref 13.0–17.0)
MCH: 28.5 pg (ref 26.0–34.0)
MCHC: 31.2 g/dL (ref 30.0–36.0)
MCV: 91.3 fL (ref 78.0–100.0)
Platelets: 227 10*3/uL (ref 150–400)
RBC: 2.53 MIL/uL — AB (ref 4.22–5.81)
RDW: 15.9 % — ABNORMAL HIGH (ref 11.5–15.5)
WBC: 11.6 10*3/uL — ABNORMAL HIGH (ref 4.0–10.5)

## 2018-04-28 LAB — C DIFFICILE QUICK SCREEN W PCR REFLEX
C DIFFICILE (CDIFF) INTERP: NOT DETECTED
C DIFFICILE (CDIFF) TOXIN: NEGATIVE
C Diff antigen: NEGATIVE

## 2018-04-28 NOTE — Progress Notes (Signed)
Pulmonary Critical Care Medicine [facility] [place]   PULMONARY SERVICE  PROGRESS NOTE  Date of Service: 04/28/2018  Curtis Hopkins  ZOX:[id number]  DOB: 1946/07/28   DOA: 04/15/18  Referring Physician: Carron Curie, MD  HPI: Curtis Hopkins is a 72 y.o. male seen for follow up of Acute on Chronic Respiratory Failure.  Patient remains on T collar apparently had an episode of vomiting.  Was attempted at capping also and did not tolerate.  Right now comfortable without distress  Medications: Reviewed on Rounds  Physical Exam:  Vitals: Temperature 100.2 pulse 84 respiratory rate 21 blood pressure 133/64 saturations 100%  Ventilator Settings off of the ventilator on T collar trials  . General: Comfortable at this time . Eyes: Grossly normal lids, irises & conjunctiva . ENT: grossly tongue is normal . Neck: no obvious mass . Cardiovascular: S1 S2 normal no gallop . Respiratory: No rhonchi expansion equal . Abdomen: soft . Skin: no rash seen on limited exam . Musculoskeletal: not rigid . Psychiatric:unable to assess . Neurologic: no seizure no involuntary movements         Labs on Admission:  Basic Metabolic Panel: Recent Labs  Lab 04/23/18 0440 04/25/18 0814 04/25/18 0828 04/26/18 0538 04/27/18 0900 04/28/18 0857  NA 142  --  148* 146* 142 140  K 3.7  --  4.1 4.1 3.9 3.6  CL 109  --  111 114* 107 105  CO2 23  --  23 22 25 24   GLUCOSE 151*  --  152* 171* 155* 143*  BUN 119*  --  149* 142* 121* 129*  CREATININE 2.98*  --  3.25* 3.16* 3.36* 3.64*  CALCIUM 8.2*  --  8.4* 8.2* 8.0* 7.8*  MG 2.3 2.7*  --  2.6* 2.3  --   PHOS  --   --  5.2* 4.4 4.4 5.7*    Liver Function Tests: Recent Labs  Lab 04/25/18 0828 04/26/18 0538 04/27/18 0900 04/28/18 0857  ALBUMIN 1.8* 1.8* 2.1* 1.7*   No results for input(s): LIPASE, AMYLASE in the last 168 hours. No results for input(s): AMMONIA in the last 168 hours.  CBC: Recent Labs  Lab 04/23/18 0440  04/25/18 0814 04/26/18 0538 04/27/18 0900 04/28/18 0857  WBC 13.9* 13.0* 13.5* 16.9* 11.6*  HGB 8.3* 8.4* 8.3* 8.7* 7.2*  HCT 26.0* 27.7* 26.6* 28.3* 23.1*  MCV 89.3 91.4 92.4 92.2 91.3  PLT 355 377 395 288 227    Cardiac Enzymes: No results for input(s): CKTOTAL, CKMB, CKMBINDEX, TROPONINI in the last 168 hours.  BNP (last 3 results) No results for input(s): BNP in the last 8760 hours.  ProBNP (last 3 results) No results for input(s): PROBNP in the last 8760 hours.  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 EXAM: CT HEAD WITHOUT CONTRAST TECHNIQUE: Contiguous axial images were obtained from the base of the skull through the vertex without intravenous contrast. COMPARISON:  None. FINDINGS: Brain: No evidence of acute infarction, hemorrhage, hydrocephalus, extra-axial collection or mass lesion/mass effect. There is ill-defined hypoattenuation within the pons which may represent areas of chronic infarction given history of basilar thrombosis. There are mild chronic microvascular ischemic changes and parenchymal volume loss of the brain. Vascular: Calcific atherosclerosis of carotid siphons. Skull: Normal. Negative for fracture or focal lesion. Sinuses/Orbits: No acute finding. Other: None. IMPRESSION: 1. No acute intracranial abnormality identified. 2. Patchy  hypoattenuation within the pons may represent chronic infarction given history of basilar thrombosis. This area is prone to artifact on CT and would be better assessed with MRI. 3. Background of mild chronic microvascular ischemic changes of the brain and parenchymal volume loss. Electronically Signed   By: Mitzi HansenLance  Furusawa-Stratton M.D.   On: 04/28/2018 02:24   Ir Fluoro Guide Cv Line Right  Result Date: 04/26/2018 INDICATION: 72 year old male with acute tubular necrosis in need of hemodialysis. He  presents for placement of a temporary hemodialysis catheter EXAM: IR ULTRASOUND GUIDANCE VASC ACCESS RIGHT; IR RIGHT FLOURO GUIDE CV LINE MEDICATIONS: None ANESTHESIA/SEDATION: None FLUOROSCOPY TIME:  Fluoroscopy Time: 0 minutes 6 seconds (2 mGy). COMPLICATIONS: None immediate. PROCEDURE: Informed written consent was obtained from the patient after a thorough discussion of the procedural risks, benefits and alternatives. All questions were addressed. Maximal Sterile Barrier Technique was utilized including caps, mask, sterile gowns, sterile gloves, sterile drape, hand hygiene and skin antiseptic. A timeout was performed prior to the initiation of the procedure. The right internal jugular vein was interrogated with ultrasound and found to be widely patent. An image was obtained and stored for the medical record. Local anesthesia was attained by infiltration with 1% lidocaine. A small dermatotomy was made. Under real-time sonographic guidance, the vessel was punctured with a 21 gauge micropuncture needle. Using standard technique, the initial micro needle was exchanged over a 0.018 micro wire for a transitional 4 [place] micro sheath. The micro sheath was then exchanged over a 0.035 wire for a dilator in the soft tissue tract was dilated. A 20 cm triple-lumen hemodialysis catheter was then advanced over the wire and position with the tip in the upper right atrium. The catheter flushes and aspirates with ease. Catheter was flushed with heparinized saline and secured to the skin with 0 Prolene suture. The patient tolerated the procedure well. IMPRESSION: Successful placement of a right IJ approach temporary hemodialysis catheter. The catheter is ready for immediate use. Electronically Signed   By: Malachy MoanHeath  McCullough M.D.   On: 04/26/2018 14:11   Ir [place] Guide Vasc Access Right  Result Date: 04/26/2018 INDICATION: 72 year old male with acute tubular necrosis in need of hemodialysis. He presents for placement of a  temporary hemodialysis catheter EXAM: IR ULTRASOUND GUIDANCE VASC ACCESS RIGHT; IR RIGHT FLOURO GUIDE CV LINE MEDICATIONS: None ANESTHESIA/SEDATION: None FLUOROSCOPY TIME:  Fluoroscopy Time: 0 minutes 6 seconds (2 mGy). COMPLICATIONS: None immediate. PROCEDURE: Informed written consent was obtained from the patient after a thorough discussion of the procedural risks, benefits and alternatives. All questions were addressed. Maximal Sterile Barrier Technique was utilized including caps, mask, sterile gowns, sterile gloves, sterile drape, hand hygiene and skin antiseptic. A timeout was performed prior to the initiation of the procedure. The right internal jugular vein was interrogated with ultrasound and found to be widely patent. An image was obtained and stored for the medical record. Local anesthesia was attained by infiltration with 1% lidocaine. A small dermatotomy was made. Under real-time sonographic guidance, the vessel was punctured with a 21 gauge micropuncture needle. Using standard technique, the initial micro needle was exchanged over a 0.018 micro wire for a transitional 4 [place] micro sheath. The micro sheath was then exchanged over a 0.035 wire for a dilator in the soft tissue tract was dilated. A 20 cm triple-lumen hemodialysis catheter was then advanced over the wire and position with the tip in the upper right atrium. The catheter flushes and aspirates with ease. Catheter was flushed with heparinized saline  and secured to the skin with 0 Prolene suture. The patient tolerated the procedure well. IMPRESSION: Successful placement of a right IJ approach temporary hemodialysis catheter. The catheter is ready for immediate use. Electronically Signed   By: Malachy Moan M.D.   On: 04/26/2018 14:11   Dg Abd Portable 1v  Result Date: 04/27/2018 CLINICAL DATA:  Evaluate PICC position EXAM: PORTABLE ABDOMEN - 1 VIEW COMPARISON:  04/10/2018 FINDINGS: A percutaneous gastrostomy tube projects over the left  para median epigastrium. No contrast was injected. Moderate stool volume. Nonobstructive bowel gas pattern. No concerning mass effect or gas collection. IMPRESSION: Peg tubing projects over the epigastrium. Contrast could be injected if there is concern for extraluminal position. Electronically Signed   By: Marnee Spring M.D.   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 Acute on chronic respiratory failure with hypoxia continue with aerosolized T collar at 28% FiO2.  Good saturations are noted.  We will continue with pulmonary toilet and continue with aspiration precautions. 2. Chronic atrial fibrillation right now the rate is controlled we will continue to follow. 3. Pneumonia due to aspiration patient did have vomiting not clear if he might have aspirated again need to follow closely follow for fevers and follow-up x-rays. 4. Pulmonary embolism treated we will continue to monitor 5. Acute renal failure for dialysis followed by nephrology 6. Severe sepsis with shock hemodynamically stable   I have personally seen and evaluated the patient, evaluated laboratory and imaging results, formulated the assessment and plan and placed orders. The Patient requires high complexity decision making for assessment and support.  Case was discussed on Rounds with the Respiratory Therapy Staff  Yevonne Pax, MD [facility] Pulmonary Critical Care Medicine Sleep Medicine

## 2018-04-28 DEATH — deceased

## 2018-04-29 ENCOUNTER — Other Ambulatory Visit (HOSPITAL_COMMUNITY): Payer: Self-pay

## 2018-04-29 DIAGNOSIS — J690 Pneumonitis due to inhalation of food and vomit: Secondary | ICD-10-CM | POA: Diagnosis not present

## 2018-04-29 DIAGNOSIS — J9621 Acute and chronic respiratory failure with hypoxia: Secondary | ICD-10-CM | POA: Diagnosis not present

## 2018-04-29 DIAGNOSIS — I482 Chronic atrial fibrillation: Secondary | ICD-10-CM | POA: Diagnosis not present

## 2018-04-29 DIAGNOSIS — N170 Acute kidney failure with tubular necrosis: Secondary | ICD-10-CM | POA: Diagnosis not present

## 2018-04-29 LAB — RENAL FUNCTION PANEL
ALBUMIN: 1.7 g/dL — AB (ref 3.5–5.0)
ANION GAP: 16 — AB (ref 5–15)
Albumin: 1.7 g/dL — ABNORMAL LOW (ref 3.5–5.0)
Anion gap: 14 (ref 5–15)
BUN: 131 mg/dL — ABNORMAL HIGH (ref 6–20)
BUN: 142 mg/dL — ABNORMAL HIGH (ref 6–20)
CHLORIDE: 104 mmol/L (ref 101–111)
CHLORIDE: 103 mmol/L (ref 101–111)
CO2: 18 mmol/L — AB (ref 22–32)
CO2: 17 mmol/L — ABNORMAL LOW (ref 22–32)
CREATININE: 4.68 mg/dL — AB (ref 0.61–1.24)
Calcium: 8 mg/dL — ABNORMAL LOW (ref 8.9–10.3)
Calcium: 8 mg/dL — ABNORMAL LOW (ref 8.9–10.3)
Creatinine, Ser: 5.35 mg/dL — ABNORMAL HIGH (ref 0.61–1.24)
GFR calc Af Amer: 13 mL/min — ABNORMAL LOW (ref >60)
GFR calc Af Amer: 11 mL/min — ABNORMAL LOW (ref >60)
GFR calc non Af Amer: 10 mL/min — ABNORMAL LOW (ref >60)
GFR, EST NON AFRICAN AMERICAN: 11 mL/min — AB (ref >60)
Glucose, Bld: 138 mg/dL — ABNORMAL HIGH (ref 65–99)
Glucose, Bld: 104 mg/dL — ABNORMAL HIGH (ref 65–99)
PHOSPHORUS: 5.9 mg/dL — AB (ref 2.5–4.6)
POTASSIUM: 4.5 mmol/L (ref 3.5–5.1)
POTASSIUM: 5.1 mmol/L (ref 3.5–5.1)
Phosphorus: 6.1 mg/dL — ABNORMAL HIGH (ref 2.5–4.6)
Sodium: 136 mmol/L (ref 135–145)
Sodium: 136 mmol/L (ref 135–145)

## 2018-04-29 LAB — CBC
HEMATOCRIT: 24 % — AB (ref 39.0–52.0)
HEMATOCRIT: 24.7 % — AB (ref 39.0–52.0)
HEMOGLOBIN: 7.6 g/dL — AB (ref 13.0–17.0)
Hemoglobin: 7.5 g/dL — ABNORMAL LOW (ref 13.0–17.0)
MCH: 28.4 pg (ref 26.0–34.0)
MCH: 28.1 pg (ref 26.0–34.0)
MCHC: 31.3 g/dL (ref 30.0–36.0)
MCHC: 30.8 g/dL (ref 30.0–36.0)
MCV: 90.9 fL (ref 78.0–100.0)
MCV: 91.5 fL (ref 78.0–100.0)
PLATELETS: 265 10*3/uL (ref 150–400)
Platelets: 151 10*3/uL (ref 150–400)
RBC: 2.64 MIL/uL — ABNORMAL LOW (ref 4.22–5.81)
RBC: 2.7 MIL/uL — ABNORMAL LOW (ref 4.22–5.81)
RDW: 16.2 % — AB (ref 11.5–15.5)
RDW: 16.1 % — ABNORMAL HIGH (ref 11.5–15.5)
WBC: 9.1 10*3/uL (ref 4.0–10.5)
WBC: 6 10*3/uL (ref 4.0–10.5)

## 2018-04-29 LAB — MAGNESIUM
MAGNESIUM: 2.2 mg/dL (ref 1.7–2.4)
MAGNESIUM: 2.3 mg/dL (ref 1.7–2.4)

## 2018-04-30 MED FILL — Medication: Qty: 1 | Status: AC

## 2018-05-04 LAB — CULTURE, BLOOD (ROUTINE X 2)

## 2018-05-28 NOTE — Progress Notes (Signed)
Pulmonary Critical Care Medicine [facility] [place]   PULMONARY SERVICE  PROGRESS NOTE  Date of Service: May 07, 2018  Curtis Hopkins  ZOX:[id number]  DOB: 1946/07/14   DOA: 04/09/2018  Referring Physician: Carron Curie, MD  HPI: Curtis Hopkins is a 72 y.o. male seen for follow up of Acute on Chronic Respiratory Failure.  Comfortable without distress remains on T collar and is on 28% oxygen good saturations are noted.  Medications: Reviewed on Rounds  Physical Exam:  Vitals: Temperature 98.6 pulse 85 respiratory rate 28 blood pressure 118/53 saturations 98%  Ventilator Settings off the ventilator on T collar  . General: Comfortable at this time . Eyes: Grossly normal lids, irises & conjunctiva . ENT: grossly tongue is normal . Neck: no obvious mass . Cardiovascular: S1 S2 normal no gallop . Respiratory: No rhonchi expansion is equal . Abdomen: soft . Skin: no rash seen on limited exam . Musculoskeletal: not rigid . Psychiatric:unable to assess . Neurologic: no seizure no involuntary movements         Labs on Admission:  Basic Metabolic Panel: Recent Labs  Lab 04/23/18 0440 04/25/18 0814 04/25/18 0828 04/26/18 0538 04/27/18 0900 04/28/18 0857 05/20/2018 0815  NA 142  --  148* 146* 142 140 136  K 3.7  --  4.1 4.1 3.9 3.6 4.5  CL 109  --  111 114* 107 105 104  CO2 23  --  23 22 25 24  18*  GLUCOSE 151*  --  152* 171* 155* 143* 138*  BUN 119*  --  149* 142* 121* 129* 131*  CREATININE 2.98*  --  3.25* 3.16* 3.36* 3.64* 4.68*  CALCIUM 8.2*  --  8.4* 8.2* 8.0* 7.8* 8.0*  MG 2.3 2.7*  --  2.6* 2.3  --  2.2  PHOS  --   --  5.2* 4.4 4.4 5.7* 6.1*    Liver Function Tests: Recent Labs  Lab 04/25/18 0828 04/26/18 0538 04/27/18 0900 04/28/18 0857 05/18/2018 0815  ALBUMIN 1.8* 1.8* 2.1* 1.7* 1.7*   No results for input(s): LIPASE, AMYLASE in the last 168 hours. No results for input(s): AMMONIA in the last 168 hours.  CBC: Recent Labs  Lab  04/25/18 0814 04/26/18 0538 04/27/18 0900 04/28/18 0857 05/21/2018 0815  WBC 13.0* 13.5* 16.9* 11.6* 9.1  HGB 8.4* 8.3* 8.7* 7.2* 7.5*  HCT 27.7* 26.6* 28.3* 23.1* 24.0*  MCV 91.4 92.4 92.2 91.3 90.9  PLT 377 395 288 227 265    Cardiac Enzymes: No results for input(s): CKTOTAL, CKMB, CKMBINDEX, TROPONINI in the last 168 hours.  BNP (last 3 results) No results for input(s): BNP in the last 8760 hours.  ProBNP (last 3 results) No results for input(s): PROBNP in the last 8760 hours.  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 EXAM: CT HEAD WITHOUT CONTRAST TECHNIQUE: Contiguous axial images were obtained from the base of the skull through the vertex without intravenous contrast. COMPARISON:  None. FINDINGS: Brain: No evidence of acute infarction, hemorrhage, hydrocephalus, extra-axial collection or mass lesion/mass effect. There is ill-defined hypoattenuation within the pons which may represent areas of chronic infarction given history of basilar thrombosis. There are mild chronic microvascular ischemic changes and parenchymal volume loss of the brain. Vascular: Calcific atherosclerosis of carotid siphons. Skull: Normal. Negative for fracture or focal lesion. Sinuses/Orbits: No acute finding. Other: None. IMPRESSION: 1. No acute intracranial abnormality  identified. 2. Patchy hypoattenuation within the pons may represent chronic infarction given history of basilar thrombosis. This area is prone to artifact on CT and would be better assessed with MRI. 3. Background of mild chronic microvascular ischemic changes of the brain and parenchymal volume loss. Electronically Signed   By: Mitzi HansenLance  Furusawa-Stratton M.D.   On: 04/28/2018 02:24   Ir Fluoro Guide Cv Line Right  Result Date: 04/26/2018 INDICATION: 72 year old male with acute tubular necrosis in need of  hemodialysis. He presents for placement of a temporary hemodialysis catheter EXAM: IR ULTRASOUND GUIDANCE VASC ACCESS RIGHT; IR RIGHT FLOURO GUIDE CV LINE MEDICATIONS: None ANESTHESIA/SEDATION: None FLUOROSCOPY TIME:  Fluoroscopy Time: 0 minutes 6 seconds (2 mGy). COMPLICATIONS: None immediate. PROCEDURE: Informed written consent was obtained from the patient after a thorough discussion of the procedural risks, benefits and alternatives. All questions were addressed. Maximal Sterile Barrier Technique was utilized including caps, mask, sterile gowns, sterile gloves, sterile drape, hand hygiene and skin antiseptic. A timeout was performed prior to the initiation of the procedure. The right internal jugular vein was interrogated with ultrasound and found to be widely patent. An image was obtained and stored for the medical record. Local anesthesia was attained by infiltration with 1% lidocaine. A small dermatotomy was made. Under real-time sonographic guidance, the vessel was punctured with a 21 gauge micropuncture needle. Using standard technique, the initial micro needle was exchanged over a 0.018 micro wire for a transitional 4 [place] micro sheath. The micro sheath was then exchanged over a 0.035 wire for a dilator in the soft tissue tract was dilated. A 20 cm triple-lumen hemodialysis catheter was then advanced over the wire and position with the tip in the upper right atrium. The catheter flushes and aspirates with ease. Catheter was flushed with heparinized saline and secured to the skin with 0 Prolene suture. The patient tolerated the procedure well. IMPRESSION: Successful placement of a right IJ approach temporary hemodialysis catheter. The catheter is ready for immediate use. Electronically Signed   By: Malachy MoanHeath  McCullough M.D.   On: 04/26/2018 14:11   Ir Kore[place] Guide Vasc Access Right  Result Date: 04/26/2018 INDICATION: 72 year old male with acute tubular necrosis in need of hemodialysis. He presents for  placement of a temporary hemodialysis catheter EXAM: IR ULTRASOUND GUIDANCE VASC ACCESS RIGHT; IR RIGHT FLOURO GUIDE CV LINE MEDICATIONS: None ANESTHESIA/SEDATION: None FLUOROSCOPY TIME:  Fluoroscopy Time: 0 minutes 6 seconds (2 mGy). COMPLICATIONS: None immediate. PROCEDURE: Informed written consent was obtained from the patient after a thorough discussion of the procedural risks, benefits and alternatives. All questions were addressed. Maximal Sterile Barrier Technique was utilized including caps, mask, sterile gowns, sterile gloves, sterile drape, hand hygiene and skin antiseptic. A timeout was performed prior to the initiation of the procedure. The right internal jugular vein was interrogated with ultrasound and found to be widely patent. An image was obtained and stored for the medical record. Local anesthesia was attained by infiltration with 1% lidocaine. A small dermatotomy was made. Under real-time sonographic guidance, the vessel was punctured with a 21 gauge micropuncture needle. Using standard technique, the initial micro needle was exchanged over a 0.018 micro wire for a transitional 4 [place] micro sheath. The micro sheath was then exchanged over a 0.035 wire for a dilator in the soft tissue tract was dilated. A 20 cm triple-lumen hemodialysis catheter was then advanced over the wire and position with the tip in the upper right atrium. The catheter flushes and aspirates with ease. Catheter was flushed  with heparinized saline and secured to the skin with 0 Prolene suture. The patient tolerated the procedure well. IMPRESSION: Successful placement of a right IJ approach temporary hemodialysis catheter. The catheter is ready for immediate use. Electronically Signed   By: Malachy Moan M.D.   On: 04/26/2018 14:11   Dg Abd Portable 1v  Result Date: 04/27/2018 CLINICAL DATA:  Evaluate PICC position EXAM: PORTABLE ABDOMEN - 1 VIEW COMPARISON:  04/09/2018 FINDINGS: A percutaneous gastrostomy tube  projects over the left para median epigastrium. No contrast was injected. Moderate stool volume. Nonobstructive bowel gas pattern. No concerning mass effect or gas collection. IMPRESSION: Peg tubing projects over the epigastrium. Contrast could be injected if there is concern for extraluminal position. Electronically Signed   By: Marnee Spring M.D.   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 Acute on chronic respiratory failure with hypoxia continue weaning on T collar continue pulmonary toilet secretion management.  Patient's been tolerating it well.  Not able to However has failed attempts at capping we will continue supportive care 2. Chronic atrial fibrillation rate is controlled we will continue to follow. 3. Stroke secondary to basilar artery occlusion restorative therapy really has had no much improvement. 4. Pneumonia due to aspiration will continue with present therapy 5. Pulmonary embolism at baseline 6. Severe sepsis with shock stable at this time we will continue supportive care 7. Acute renal failure followed by nephrology will continue present management   I have personally seen and evaluated the patient, evaluated laboratory and imaging results, formulated the assessment and plan and placed orders. The Patient requires high complexity decision making for assessment and support.  Case was discussed on Rounds with the Respiratory Therapy Staff  Yevonne Pax, MD [facility] Pulmonary Critical Care Medicine Sleep Medicine

## 2018-05-28 DEATH — deceased

## 2018-11-28 DEATH — deceased

## 2019-01-20 IMAGING — DX DG CHEST 1V PORT
1 series · 1 of 1 positions shown · non-contrast
Comparison: Portable exam 8464 hours compared to 03/28/2018

CLINICAL DATA: Pneumonia

EXAM:
PORTABLE CHEST 1 VIEW

[chest ap]
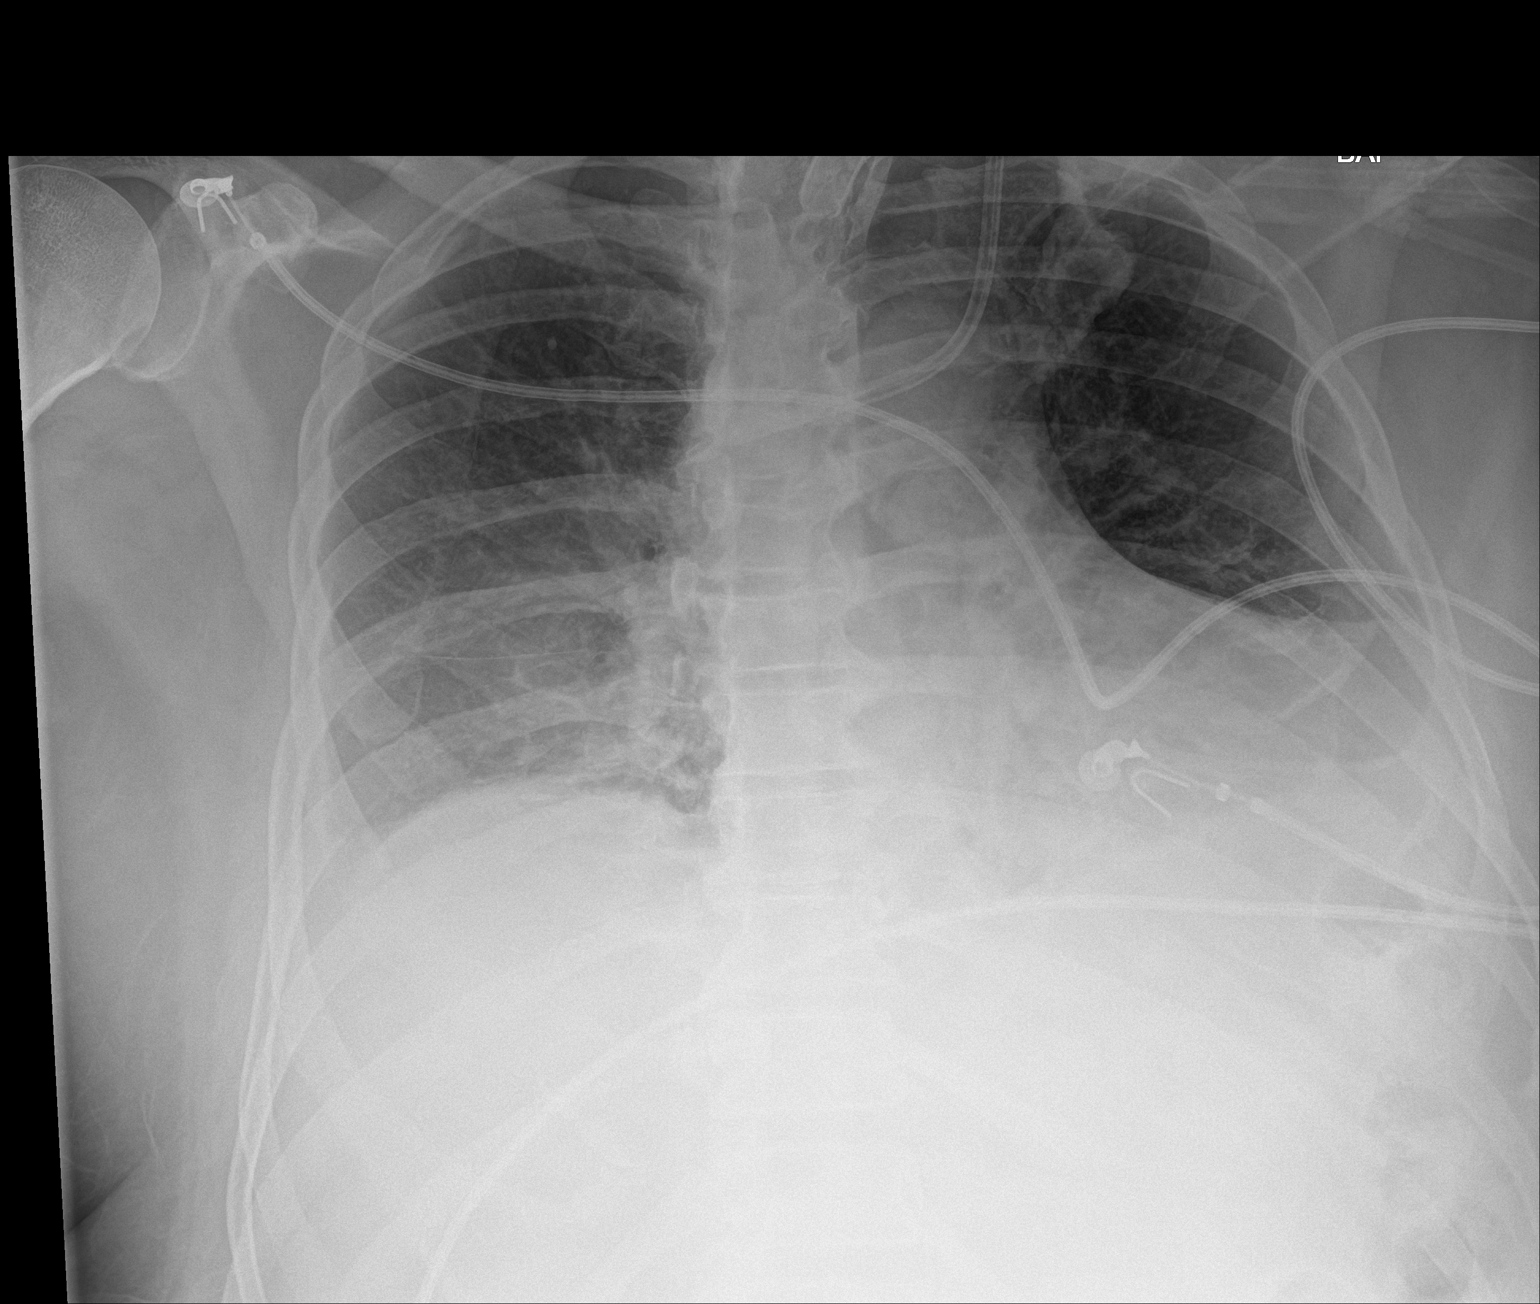

[1 of 1 positions shown; findings below may reference images not displayed]

FINDINGS: Tracheostomy tube and LEFT jugular line stable.

Borderline enlargement of cardiac silhouette.

Improved bibasilar infiltrates.

Upper lungs clear.

Probable tiny pleural effusions.

No pneumothorax.
IMPRESSION: Improved bibasilar infiltrates.

## 2019-02-10 IMAGING — XA IR FLUORO GUIDE CV LINE*R*
1 series · 1 of 1 positions shown · non-contrast
Comparison: none

INDICATION: 71-year-old male with acute tubular necrosis in need of
hemodialysis. He presents for placement of a temporary hemodialysis
catheter

[Series 1: fl (-) angio · 1 of 1 slices shown]
[im 1/1]
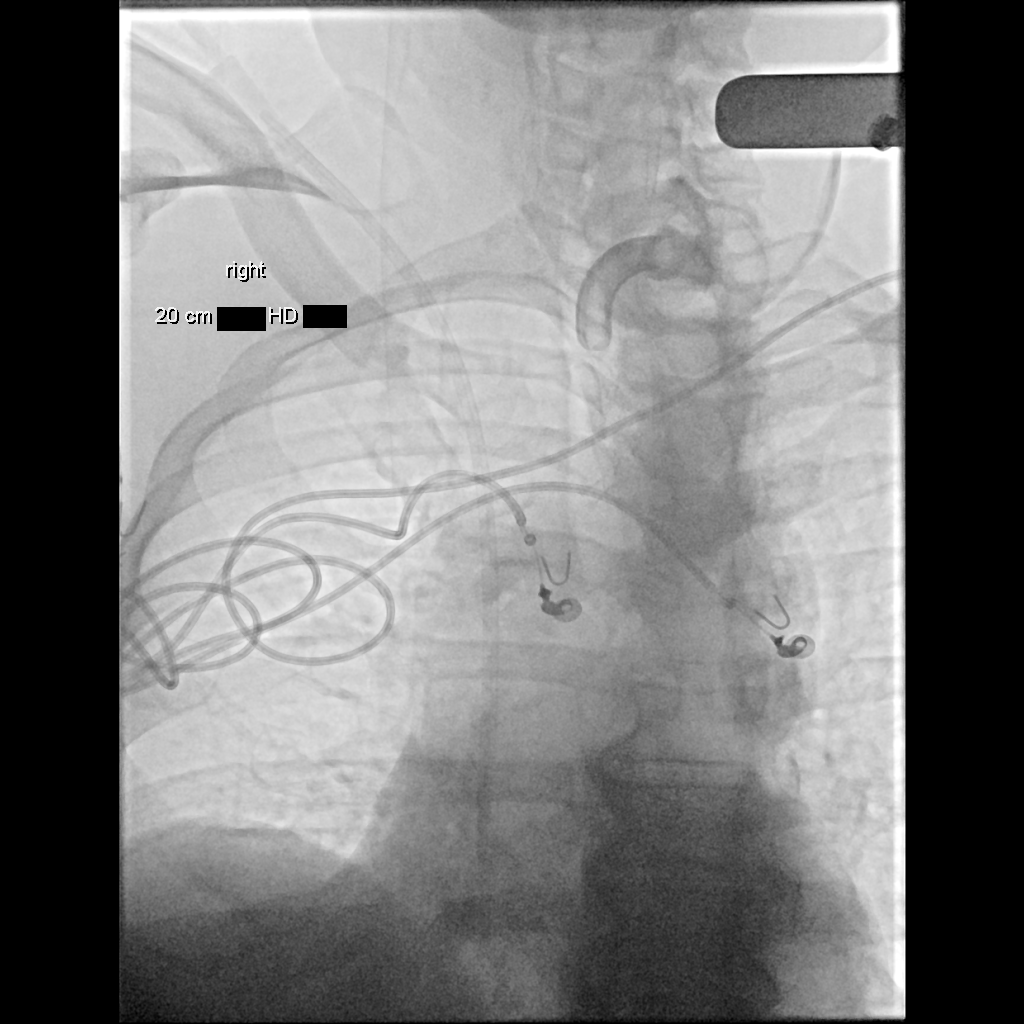

[1 of 1 positions shown; findings below may reference images not displayed]

EXAM:
IR ULTRASOUND GUIDANCE VASC ACCESS RIGHT; IR RIGHT FLOURO GUIDE CV
LINE

MEDICATIONS:
None

ANESTHESIA/SEDATION:
None

FLUOROSCOPY TIME:  Fluoroscopy Time: 0 minutes 6 seconds (2 mGy).

COMPLICATIONS:
None immediate.

PROCEDURE:
Informed written consent was obtained from the patient after a
thorough discussion of the procedural risks, benefits and
alternatives. All questions were addressed. Maximal Sterile Barrier
Technique was utilized including caps, mask, sterile gowns, sterile
gloves, sterile drape, hand hygiene and skin antiseptic. A timeout
was performed prior to the initiation of the procedure.

The right internal jugular vein was interrogated with ultrasound and
found to be widely patent. An image was obtained and stored for the
medical record. Local anesthesia was attained by infiltration with
1% lidocaine. A small dermatotomy was made. Under real-time
sonographic guidance, the vessel was punctured with a 21 gauge
micropuncture needle. Using standard technique, the initial micro
needle was exchanged over a 0.018 micro wire for a transitional 4
French micro sheath. The micro sheath was then exchanged over a
0.035 wire for a dilator in the soft tissue tract was dilated. A 20
cm triple-lumen hemodialysis catheter was then advanced over the
wire and position with the tip in the upper right atrium.

The catheter flushes and aspirates with ease. Catheter was flushed
with heparinized saline and secured to the skin with 0 Prolene
suture. The patient tolerated the procedure well.
IMPRESSION: Successful placement of a right IJ approach temporary hemodialysis
catheter. The catheter is ready for immediate use.

## 2019-02-11 IMAGING — DX DG ABD PORTABLE 1V
1 series · 1 of 1 positions shown · non-contrast
Comparison: 03/28/2018

CLINICAL DATA: Evaluate PICC position

EXAM:
PORTABLE ABDOMEN - 1 VIEW

[abdomen]
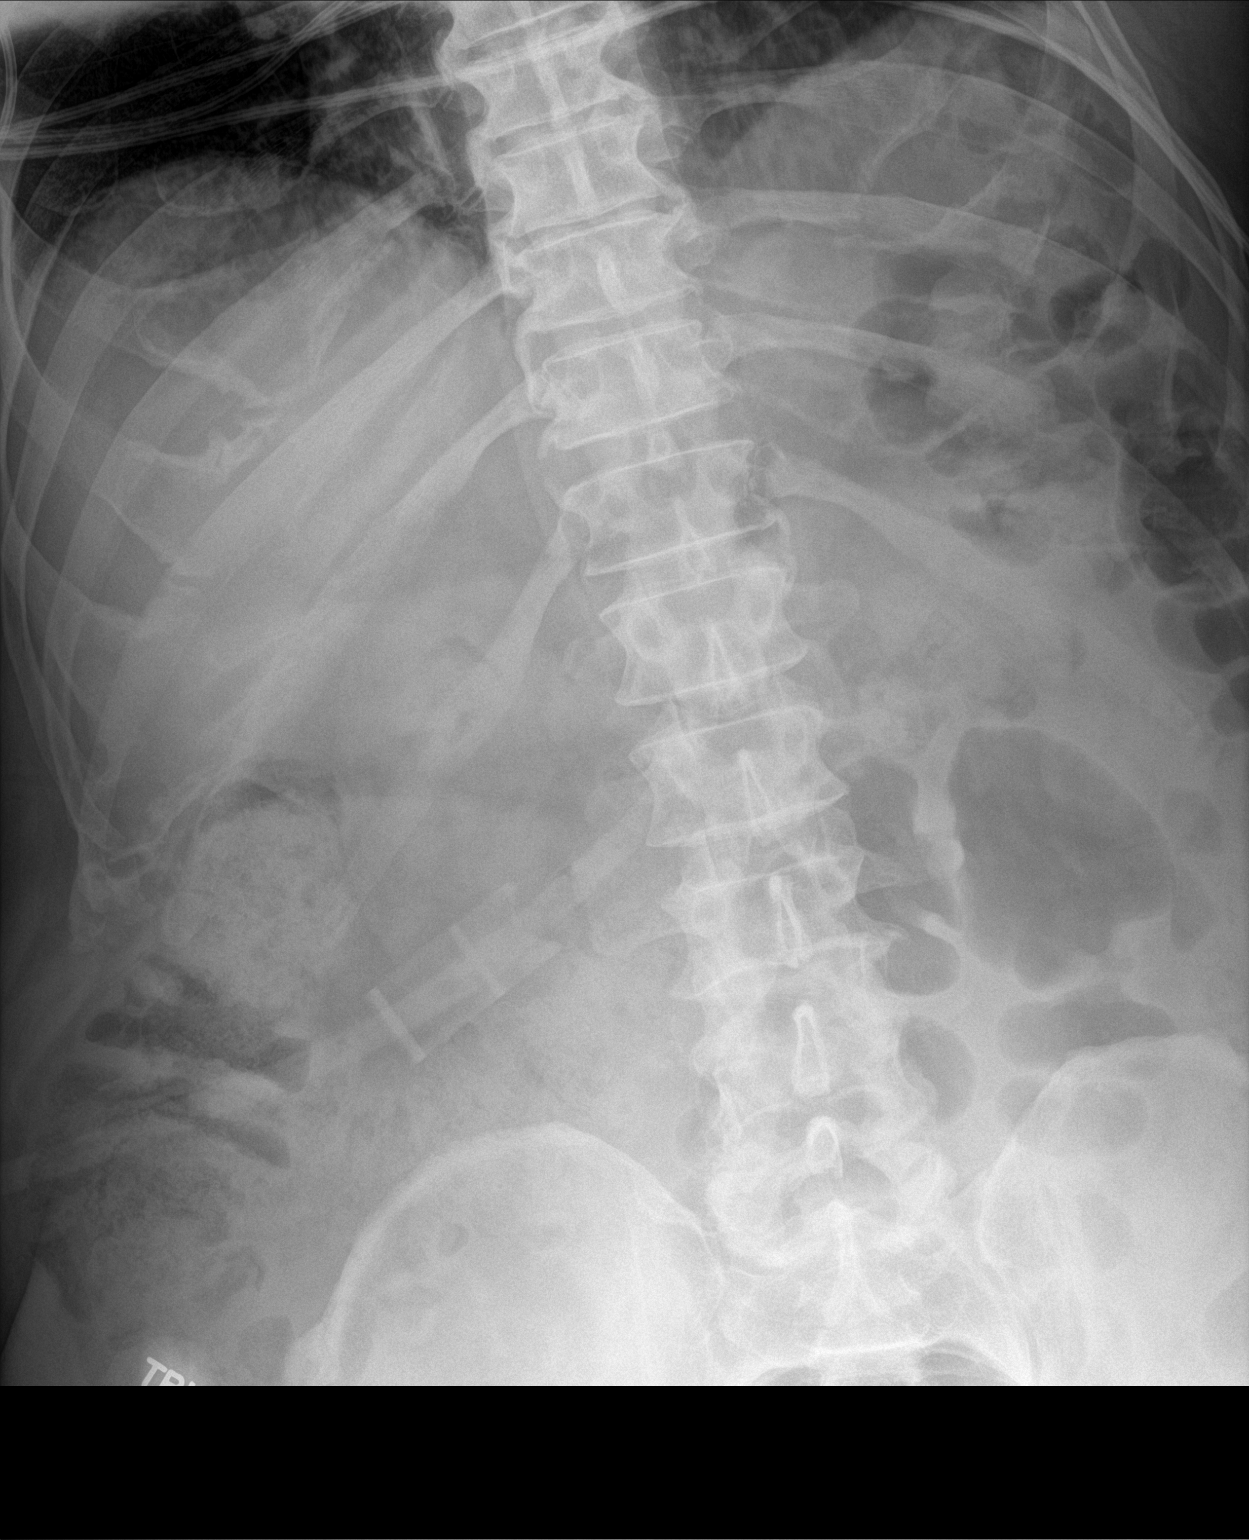

[1 of 1 positions shown; findings below may reference images not displayed]

FINDINGS: A percutaneous gastrostomy tube projects over the left para median
epigastrium. No contrast was injected. Moderate stool volume.
Nonobstructive bowel gas pattern. No concerning mass effect or gas
collection.
IMPRESSION: Peg tubing projects over the epigastrium. Contrast could be injected
if there is concern for extraluminal position.
